# Patient Record
Sex: Male | Born: 1935
Health system: Southern US, Community
[De-identification: ages and names within clinical notes are randomized; demographics above are authoritative.]

## PROBLEM LIST (undated history)

## (undated) DIAGNOSIS — K769 Liver disease, unspecified: Secondary | ICD-10-CM

## (undated) DIAGNOSIS — F0390 Unspecified dementia without behavioral disturbance: Secondary | ICD-10-CM

## (undated) DIAGNOSIS — H00019 Hordeolum externum unspecified eye, unspecified eyelid: Secondary | ICD-10-CM

## (undated) DIAGNOSIS — F0280 Dementia in other diseases classified elsewhere without behavioral disturbance: Secondary | ICD-10-CM

## (undated) DIAGNOSIS — I10 Essential (primary) hypertension: Secondary | ICD-10-CM

## (undated) DIAGNOSIS — K7290 Hepatic failure, unspecified without coma: Secondary | ICD-10-CM

## (undated) DIAGNOSIS — K7682 Hepatic encephalopathy: Secondary | ICD-10-CM

---

## 2001-08-12 ENCOUNTER — Emergency Department (HOSPITAL_COMMUNITY): Admission: EM | Admit: 2001-08-12 | Discharge: 2001-08-12 | Payer: Self-pay | Admitting: Emergency Medicine

## 2002-03-22 ENCOUNTER — Emergency Department (HOSPITAL_COMMUNITY): Admission: EM | Admit: 2002-03-22 | Discharge: 2002-03-22 | Payer: Self-pay | Admitting: Emergency Medicine

## 2002-08-22 ENCOUNTER — Emergency Department (HOSPITAL_COMMUNITY): Admission: EM | Admit: 2002-08-22 | Discharge: 2002-08-22 | Payer: Self-pay

## 2002-09-24 ENCOUNTER — Emergency Department (HOSPITAL_COMMUNITY): Admission: EM | Admit: 2002-09-24 | Discharge: 2002-09-24 | Payer: Self-pay | Admitting: Emergency Medicine

## 2004-04-02 ENCOUNTER — Emergency Department (HOSPITAL_COMMUNITY): Admission: EM | Admit: 2004-04-02 | Discharge: 2004-04-02 | Payer: Self-pay | Admitting: Emergency Medicine

## 2004-11-23 ENCOUNTER — Emergency Department (HOSPITAL_COMMUNITY): Admission: EM | Admit: 2004-11-23 | Discharge: 2004-11-23 | Payer: Self-pay | Admitting: *Deleted

## 2004-12-03 ENCOUNTER — Encounter (HOSPITAL_COMMUNITY): Admission: RE | Admit: 2004-12-03 | Discharge: 2005-01-02 | Payer: Self-pay

## 2004-12-10 ENCOUNTER — Observation Stay (HOSPITAL_COMMUNITY): Admission: EM | Admit: 2004-12-10 | Discharge: 2004-12-11 | Payer: Self-pay | Admitting: Emergency Medicine

## 2005-01-04 ENCOUNTER — Encounter (HOSPITAL_COMMUNITY): Admission: RE | Admit: 2005-01-04 | Discharge: 2005-01-22 | Payer: Self-pay | Admitting: *Deleted

## 2005-01-25 ENCOUNTER — Encounter (HOSPITAL_COMMUNITY): Admission: RE | Admit: 2005-01-25 | Discharge: 2005-02-24 | Payer: Self-pay | Admitting: *Deleted

## 2005-02-26 ENCOUNTER — Encounter (HOSPITAL_COMMUNITY): Admission: RE | Admit: 2005-02-26 | Discharge: 2005-03-28 | Payer: Self-pay | Admitting: *Deleted

## 2005-08-07 ENCOUNTER — Emergency Department (HOSPITAL_COMMUNITY): Admission: EM | Admit: 2005-08-07 | Discharge: 2005-08-07 | Payer: Self-pay | Admitting: Emergency Medicine

## 2008-10-11 ENCOUNTER — Emergency Department (HOSPITAL_COMMUNITY): Admission: EM | Admit: 2008-10-11 | Discharge: 2008-10-11 | Payer: Self-pay | Admitting: Emergency Medicine

## 2008-11-23 ENCOUNTER — Emergency Department (HOSPITAL_COMMUNITY): Admission: EM | Admit: 2008-11-23 | Discharge: 2008-11-23 | Payer: Self-pay | Admitting: Emergency Medicine

## 2010-04-05 ENCOUNTER — Emergency Department (HOSPITAL_COMMUNITY)
Admission: EM | Admit: 2010-04-05 | Discharge: 2010-04-05 | Payer: Self-pay | Source: Home / Self Care | Admitting: Emergency Medicine

## 2010-08-03 LAB — CBC
Hemoglobin: 15.7 g/dL (ref 13.0–17.0)
MCHC: 36 g/dL (ref 30.0–36.0)
MCV: 89.4 fL (ref 78.0–100.0)
RBC: 4.88 MIL/uL (ref 4.22–5.81)
WBC: 8.8 10*3/uL (ref 4.0–10.5)

## 2010-08-03 LAB — POCT CARDIAC MARKERS: Myoglobin, poc: 183 ng/mL (ref 12–200)

## 2010-08-03 LAB — DIFFERENTIAL
Basophils Absolute: 0 10*3/uL (ref 0.0–0.1)
Basophils Relative: 0 % (ref 0–1)
Eosinophils Absolute: 0.2 10*3/uL (ref 0.0–0.7)
Eosinophils Relative: 2 % (ref 0–5)
Lymphs Abs: 1.3 10*3/uL (ref 0.7–4.0)
Neutrophils Relative %: 76 % (ref 43–77)

## 2010-08-03 LAB — COMPREHENSIVE METABOLIC PANEL
ALT: 26 U/L (ref 0–53)
AST: 30 U/L (ref 0–37)
CO2: 28 mEq/L (ref 19–32)
Calcium: 9.1 mg/dL (ref 8.4–10.5)
Chloride: 102 mEq/L (ref 96–112)
Creatinine, Ser: 1.06 mg/dL (ref 0.4–1.5)
GFR calc non Af Amer: >60 mL/min (ref >60)
Glucose, Bld: 104 mg/dL — ABNORMAL HIGH (ref 70–99)
Sodium: 138 mEq/L (ref 135–145)
Total Bilirubin: 0.9 mg/dL (ref 0.3–1.2)

## 2010-08-03 LAB — BRAIN NATRIURETIC PEPTIDE: Pro B Natriuretic peptide (BNP): <30 pg/mL (ref 0.0–100.0)

## 2010-09-11 NOTE — H&P (Signed)
NAME:  YOVANY, CLOCK NO.:  [id number]   MEDICAL RECORD NO.:  [id number]          PATIENT TYPE:  OBV   LOCATION:  A213                          FACILITY:  [facility]   PHYSICIAN:  Calvert Cantor, M.D.     DATE OF BIRTH:  [birth year]   DATE OF ADMISSION:  12/10/2004  DATE OF DISCHARGE:  LH                                HISTORY & PHYSICAL   PRIMARY CARE PHYSICIAN:  Dr. Kateri Mc.   PRESENTING COMPLAINT:  Swelling of the left half of his tongue.   HISTORY OF PRESENT ILLNESS:  This is a 77 year old white male, well educated  and quite knowledgeable about his health issues. The patient states that he  has had angioedema secondary to Lotensin in the past which first occurred  five years ago. Lotensin was discontinued; however, he had episodes of  angioedema about five or six times since then. His last episode was one year  ago. Recently, the patient had a cath on November 27, 2004 with stent  placement. Since then, he has been placed on beta blocker, aspirin, Lasix,  Lipitor, and Zetia.   The patient states that today, while he was eating lunch about 1:30 in the  afternoon, he noticed that his tongue was swollen. He came into the ER.  While he was coming in, his tongue swelling worsened. The patient stated  that once in the ER he was given steroids, Pepcid, and Benadryl, and the  swelling improved. Currently, there is no swelling at all. He does not  complain of any itching, flushing, rash, or lightheadedness associated with  the swelling. His blood pressure was also normal once he came to the ER. He  did not have any rash as per the ER doctor.   PAST MEDICAL HISTORY:  1.  Coronary artery disease, status post cath with stent placement.  2.  Hypertension.  3.  Hypercholesterolemia.  4.  Obesity.  5.  Gout.   PAST SURGICAL HISTORY:  He had a cath on November 27, 2004. He has had an  appendectomy and a mastoidectomy in the past.   ALLERGIES:  He is allergic to LOTENSIN which  causes angioedema. He has no  allergies to food or anything in the environment that he knows of. He has  been checked by an allergist a couple of years ago who ran a panel of tests  on him and told him that she could not find anything that he was allergic  to.   FAMILY HISTORY:  His mother passed away of old age. Father passed away of  unknown cause. He has a sister who had shortness of breath. He is not sure  why.   SOCIAL HISTORY:  He smokes one cigar every now and then. He drinks wine  occasionally. He is separated. He lives alone.   PHYSICAL EXAMINATION:  VITAL SIGNS:  Blood pressure was 136/80, temperature  98, pulse 79, respiratory rate 20, pulse oximeter 97% on room.  HEENT:  Normocephalic and atraumatic. Pupils are equal, round, and reactive  to light. Oral mucosa is moist. Tongue is normal  size. He is not having any  slurred speech.  NECK:  Supple. There is no lymphadenopathy.  HEART:  Regular rate and rhythm.  LUNGS:  Clear bilaterally.  ABDOMEN:  Soft, obese, nontender, and nondistended. Bowel sounds are  positive.  EXTREMITIES:  Show no clubbing, cyanosis, or edema. Pedal pulses are  positive.   REVIEW OF SYSTEMS:  Further negative for cough, fever, chills, runny nose,  chest pain, diarrhea, dysuria, pyuria.   BLOOD WORK:  WBC count is 13.2 with a slight left shift, hemoglobin 14.9,  hematocrit 42.9, platelets 333,000. Blood smear further shows polychromasia  of the RBCs and large platelets. Sodium 138, potassium 4.3, chloride 103,  bicarbonate 23, glucose 165, BUN 19, creatinine 1.3. Alkaline phosphate 113,  AST 41, ALT 30. Total protein 7.3. Albumin 4.0, calcium 9.1. CK is 78, CK-MB  is 2.4. Troponin is 0.03.   ASSESSMENT/PLAN:  This is a 77 year old white male who has a history of  angioedema who states that his tongue was swollen today. This is also most  likely secondary to angioedema. The patient received Solu-Medrol in the  emergency room. He will be  continued on a prednisone taper. Furthermore, he  is on Pepcid and Benadryl. Blood work is showing elevated white count with  some slight left shift; however, the patient has no signs of infection. I  will do a urinalysis and check a chest x-ray. For now, I will hold off on  antibiotics.   Cardiac enzymes will be checked and repeat blood work will be done in the  morning to further check for leukocytosis. In addition, I will obtain a C1  esterase inhibitor level and a C1Q level to rule out acquired angioedema. I  will resume him on his home medications. He is ambulatory and therefore will  not need deep vein thrombosis precautions currently.      Calvert Cantor, M.D.  Electronically Signed     SR/MEDQ  D:  12/10/2004  T:  12/10/2004  Job:  [id number]

## 2010-09-11 NOTE — Discharge Summary (Signed)
NAME:  Joseph Delgado, Joseph Delgado NO.:  [id number]   MEDICAL RECORD NO.:  [id number]          PATIENT TYPE:  OBV   LOCATION:  A213                          FACILITY:  [facility]   PHYSICIAN:  Calvert Cantor, M.D.     DATE OF BIRTH:  09/22/1928   DATE OF ADMISSION:  DATE OF DISCHARGE:  LH                                 DISCHARGE SUMMARY   DISCHARGE DIAGNOSES:  1.  Mild episode of angioedema.  2.  Coronary artery disease, status post catheterization with stent      placement.  3.  Hypertension.  4.  Hypercholesterolemia.  5.  Obesity.  6.  Gout.   DISCHARGE MEDICATIONS:  The patient is being discharged on rapid prednisone  taper.  He is also to take Benadryl 25-50 mg p.o. and Pepcid 20 mg p.o.  q.12h. if he has another episode of angioedema.   The patient is to continue on his outpatient medications, which include  Lopressor, aspirin, Lasix, Lipitor, Zetia.   HOSPITAL COURSE:  This is a 77 year old white male who was admitted for an  episode of tongue swelling.  The patient's swelling improved once he  received steroids, Pepcid, and Benadryl in the ER.  He was monitored  overnight to look for further angioedema.  The patient did well through the  night and had no complaints.   The patient has a history of angioedema from ACE inhibitors.  Currently, he  believed that his beta blockers may be causing this episode of angioedema.  I have explained to him that I strongly doubt this; however, I have referred  him back to his primary care physician to decide whether or not he needs to  continue taking beta blocker.  He has recently had a cath and has coronary  artery disease and therefore most likely needs to continue on the beta  blockers.   The patient states that he has had about 5 episodes of angioedema ever since  he stopped the ACE inhibitor.  No cause has been found.  He has been seen by  allergist who was unable to determine anything that he is allergic to.  He  states  that he is going to follow up with an allergist at [facility]. I have  obtained a C1 esterase inhibitor and a C1Q level on him to rule out acquired  angioedema.  The results are pending.   FOLLOWUP:  If there are any positive blood results, I will be contacting the  patient to let him know.   VITALS ON DISCHARGE:  Temperature 97.4, pulse 90, respiratory rate 18, blood  pressure 119/61.   BLOOD WORK:  White count is 17.2, which was normal on admission but has  elevated from the steroids.  Hemoglobin is 14.1.  Hematocrit 40.6.  Platelets 325.  Sodium 138.  Potassium 4.7.  Chloride 106.  Bicarb 25.  Glucose 217.  BUN 20.  Creatinine 1.3.  Calcium 92.  CK was 78.  CK-MB was  2.4.  Troponin was 0.03.      Calvert Cantor, M.D.  Electronically Signed  SR/MEDQ  D:  12/12/2004  T:  12/12/2004  Job:  [id number]   cc:   Wyvonnia Dusky, M.D.  [facility]  [address]  [place], [place].

## 2013-06-12 ENCOUNTER — Encounter (HOSPITAL_COMMUNITY): Payer: Self-pay | Admitting: Emergency Medicine

## 2013-06-12 DIAGNOSIS — Z792 Long term (current) use of antibiotics: Secondary | ICD-10-CM | POA: Insufficient documentation

## 2013-06-12 DIAGNOSIS — S0003XA Contusion of scalp, initial encounter: Secondary | ICD-10-CM | POA: Insufficient documentation

## 2013-06-12 DIAGNOSIS — Z8669 Personal history of other diseases of the nervous system and sense organs: Secondary | ICD-10-CM | POA: Insufficient documentation

## 2013-06-12 DIAGNOSIS — Z8719 Personal history of other diseases of the digestive system: Secondary | ICD-10-CM | POA: Insufficient documentation

## 2013-06-12 DIAGNOSIS — S1093XA Contusion of unspecified part of neck, initial encounter: Secondary | ICD-10-CM | POA: Insufficient documentation

## 2013-06-12 DIAGNOSIS — Y929 Unspecified place or not applicable: Secondary | ICD-10-CM | POA: Insufficient documentation

## 2013-06-12 DIAGNOSIS — D649 Anemia, unspecified: Secondary | ICD-10-CM | POA: Insufficient documentation

## 2013-06-12 DIAGNOSIS — Z7982 Long term (current) use of aspirin: Secondary | ICD-10-CM | POA: Insufficient documentation

## 2013-06-12 DIAGNOSIS — W1809XA Striking against other object with subsequent fall, initial encounter: Secondary | ICD-10-CM | POA: Insufficient documentation

## 2013-06-12 DIAGNOSIS — F0280 Dementia in other diseases classified elsewhere without behavioral disturbance: Secondary | ICD-10-CM | POA: Insufficient documentation

## 2013-06-12 DIAGNOSIS — G309 Alzheimer's disease, unspecified: Secondary | ICD-10-CM | POA: Insufficient documentation

## 2013-06-12 DIAGNOSIS — Y9389 Activity, other specified: Secondary | ICD-10-CM | POA: Insufficient documentation

## 2013-06-12 DIAGNOSIS — Z79899 Other long term (current) drug therapy: Secondary | ICD-10-CM | POA: Insufficient documentation

## 2013-06-12 HISTORY — DX: Liver disease, unspecified: K76.9

## 2013-06-12 HISTORY — DX: Hepatic encephalopathy: K76.82

## 2013-06-12 HISTORY — DX: Hordeolum externum unspecified eye, unspecified eyelid: H00.019

## 2013-06-12 HISTORY — DX: Unspecified dementia, unspecified severity, without behavioral disturbance, psychotic disturbance, mood disturbance, and anxiety: F03.90

## 2013-06-12 HISTORY — DX: Dementia in other diseases classified elsewhere, unspecified severity, without behavioral disturbance, psychotic disturbance, mood disturbance, and anxiety: F02.80

## 2013-06-12 HISTORY — DX: Hepatic failure, unspecified without coma: K72.90

## 2013-07-01 ENCOUNTER — Emergency Department (HOSPITAL_COMMUNITY)
Admission: EM | Admit: 2013-07-01 | Discharge: 2013-07-01 | Disposition: A | Discharge status: Home / Self Care | Payer: Medicare Other | Attending: Emergency Medicine | Admitting: Emergency Medicine

## 2013-07-01 DIAGNOSIS — Z79899 Other long term (current) drug therapy: Secondary | ICD-10-CM | POA: Insufficient documentation

## 2013-07-01 DIAGNOSIS — Y9301 Activity, walking, marching and hiking: Secondary | ICD-10-CM | POA: Insufficient documentation

## 2013-07-01 DIAGNOSIS — Z8669 Personal history of other diseases of the nervous system and sense organs: Secondary | ICD-10-CM | POA: Insufficient documentation

## 2013-07-01 DIAGNOSIS — Z043 Encounter for examination and observation following other accident: Secondary | ICD-10-CM | POA: Insufficient documentation

## 2013-07-01 DIAGNOSIS — Z7982 Long term (current) use of aspirin: Secondary | ICD-10-CM | POA: Insufficient documentation

## 2013-07-01 DIAGNOSIS — W010XXA Fall on same level from slipping, tripping and stumbling without subsequent striking against object, initial encounter: Secondary | ICD-10-CM | POA: Insufficient documentation

## 2013-07-01 DIAGNOSIS — G309 Alzheimer's disease, unspecified: Secondary | ICD-10-CM | POA: Insufficient documentation

## 2013-07-01 DIAGNOSIS — F0280 Dementia in other diseases classified elsewhere without behavioral disturbance: Secondary | ICD-10-CM | POA: Insufficient documentation

## 2013-07-01 DIAGNOSIS — Y929 Unspecified place or not applicable: Secondary | ICD-10-CM | POA: Insufficient documentation

## 2013-07-01 DIAGNOSIS — Z8719 Personal history of other diseases of the digestive system: Secondary | ICD-10-CM | POA: Insufficient documentation

## 2013-07-01 NOTE — ED Notes (Signed)
[facility] after walker slipped out from under him in hallway, pt fell onto tile floor, pt states he hit his head, denies LOC. Per EMS patient is demented at baseline mental status.

## 2013-07-01 NOTE — ED Notes (Signed)
Bed: WA24 Expected date:  Expected time:  Means of arrival:  Comments: fall 

## 2013-07-01 NOTE — Discharge Instructions (Signed)
Fall precautions. Return to ER if worse, new symptoms, severe pain, change in mental status, other concern.     Fall Prevention and Home Safety Falls cause injuries and can affect all age groups. It is possible to use preventive measures to significantly decrease the likelihood of falls. There are many simple measures which can make your home safer and prevent falls. OUTDOORS  Repair cracks and edges of walkways and driveways.  Remove high doorway thresholds.  Trim shrubbery on the main path into your home.  Have good outside lighting.  Clear walkways of tools, rocks, debris, and clutter.  Check that handrails are not broken and are securely fastened. Both sides of steps should have handrails.  Have leaves, snow, and ice cleared regularly.  Use sand or salt on walkways during winter months.  In the garage, clean up grease or oil spills. BATHROOM  Install night lights.  Install grab bars by the toilet and in the tub and shower.  Use non-skid mats or decals in the tub or shower.  Place a plastic non-slip stool in the shower to sit on, if needed.  Keep floors dry and clean up all water on the floor immediately.  Remove soap buildup in the tub or shower on a regular basis.  Secure bath mats with non-slip, double-sided rug tape.  Remove throw rugs and tripping hazards from the floors. BEDROOMS  Install night lights.  Make sure a bedside light is easy to reach.  Do not use oversized bedding.  Keep a telephone by your bedside.  Have a firm chair with side arms to use for getting dressed.  Remove throw rugs and tripping hazards from the floor. KITCHEN  Keep handles on pots and pans turned toward the center of the stove. Use back burners when possible.  Clean up spills quickly and allow time for drying.  Avoid walking on wet floors.  Avoid hot utensils and knives.  Position shelves so they are not too high or low.  Place commonly used objects within easy  reach.  If necessary, use a sturdy step stool with a grab bar when reaching.  Keep electrical cables out of the way.  Do not use floor polish or wax that makes floors slippery. If you must use wax, use non-skid floor wax.  Remove throw rugs and tripping hazards from the floor. STAIRWAYS  Never leave objects on stairs.  Place handrails on both sides of stairways and use them. Fix any loose handrails. Make sure handrails on both sides of the stairways are as long as the stairs.  Check carpeting to make sure it is firmly attached along stairs. Make repairs to worn or loose carpet promptly.  Avoid placing throw rugs at the top or bottom of stairways, or properly secure the rug with carpet tape to prevent slippage. Get rid of throw rugs, if possible.  Have an electrician put in a light switch at the top and bottom of the stairs. OTHER FALL PREVENTION TIPS  Wear low-heel or rubber-soled shoes that are supportive and fit well. Wear closed toe shoes.  When using a stepladder, make sure it is fully opened and both spreaders are firmly locked. Do not climb a closed stepladder.  Add color or contrast paint or tape to grab bars and handrails in your home. Place contrasting color strips on first and last steps.  Learn and use mobility aids as needed. Install an electrical emergency response system.  Turn on lights to avoid dark areas. Replace light bulbs that  burn out immediately. Get light switches that glow.  Arrange furniture to create clear pathways. Keep furniture in the same place.  Firmly attach carpet with non-skid or double-sided tape.  Eliminate uneven floor surfaces.  Select a carpet pattern that does not visually hide the edge of steps.  Be aware of all pets. OTHER HOME SAFETY TIPS  Set the water temperature for 120 F (48.8 C).  Keep emergency numbers on or near the telephone.  Keep smoke detectors on every level of the home and near sleeping areas. Document Released:  04/02/2002 Document Revised: 10/12/2011 Document Reviewed: 07/02/2011 [facility] Patient Information 2014 [place], [place].

## 2013-07-01 NOTE — ED Provider Notes (Signed)
CSN: [id number]     Arrival date & time 07/01/13  1109 History   First MD Initiated Contact with Patient 07/01/13 1112     Chief Complaint  Patient presents with  . Fall     (Consider location/radiation/quality/duration/timing/severity/associated sxs/prior Treatment) Patient is a 80 y.o. male presenting with fall. The history is provided by the patient and the EMS personnel.  Fall Pertinent negatives include no chest pain, no abdominal pain, no headaches and no shortness of breath.  pt with hx dementia, from [facility], s/p fall this morning. Pt was walking w walker.  At baseline walks v slowly w walker. ems states walker slid/got away from pt, he feel to ground, no loc.  Per ems report, pts mental status has remained at baseline since fall.  Pt denies any lightheadedness or dizziness. No loc. No headache or head contusion. No neck or back pain. No numbness/weakness. No anticoag use. Pt states feels fine, no c/o.       Past Medical History  Diagnosis Date  . Dementia   . Alzheimer disease   . Hepatic encephalopathy   . Liver disease   . Stye    No past surgical history on file. No family history on file. History  Substance Use Topics  . Smoking status: Unknown If Ever Smoked  . Smokeless tobacco: Not on file  . Alcohol Use: No    Review of Systems  Constitutional: Negative for fever.  HENT: Negative for facial swelling.   Eyes: Negative for redness.  Respiratory: Negative for shortness of breath.   Cardiovascular: Negative for chest pain.  Gastrointestinal: Negative for nausea, vomiting and abdominal pain.  Genitourinary: Negative for flank pain.  Musculoskeletal: Negative for back pain and neck pain.  Skin: Negative for rash.  Neurological: Negative for weakness, numbness and headaches.  Hematological: Does not bruise/bleed easily.  Psychiatric/Behavioral: Negative for agitation.      Allergies  Review of patient's allergies indicates no known allergies.  Home  Medications   Current Outpatient Rx  Name  Route  Sig  Dispense  Refill  . aspirin 81 MG tablet   Oral   Take 81 mg by mouth daily.         . bicalutamide (CASODEX) 50 MG tablet   Oral   Take 50 mg by mouth daily.         Marland Kitchen erythromycin ophthalmic ointment      Apply 1/4 inch ribbon in the left eye four times per day         . haloperidol (HALDOL) 1 MG tablet   Oral   Take 1 mg by mouth 2 (two) times daily.         Marland Kitchen lactulose (CHRONULAC) 10 GM/15ML solution   Oral   Take 30 g by mouth 2 (two) times daily as needed for mild constipation.         . Melatonin 1 MG TABS   Oral   Take 1 tablet by mouth at bedtime as needed (sleep).         . Multiple Vitamin (THERA/BETA-CAROTENE PO)   Oral   Take 1 tablet by mouth daily.         Marland Kitchen omeprazole (PRILOSEC) 20 MG capsule   Oral   Take 20 mg by mouth daily.          BP 146/54  Pulse 87  Temp(Src) 98.2 F (36.8 C) (Oral)  Resp 14  SpO2 98% Physical Exam  Nursing note and vitals reviewed. Constitutional:  He is oriented to person, place, and time. He appears well-developed and well-nourished. No distress.  HENT:  Head: Atraumatic.  Mouth/Throat: Oropharynx is clear and moist.  No facial or scalp pain, sts, or tenderness.   Eyes: Pupils are equal, round, and reactive to light.  Neck: Normal range of motion. Neck supple. No tracheal deviation present.  Cardiovascular: Normal rate, normal heart sounds and intact distal pulses.   Pulmonary/Chest: Effort normal and breath sounds normal. No accessory muscle usage. No respiratory distress. He exhibits no tenderness.  Abdominal: Soft. Bowel sounds are normal. He exhibits no distension. There is no tenderness.  Musculoskeletal: Normal range of motion.  CTLS spine, non tender, aligned, no step off. Good rom bil ext without pain or focal bony tenderness.   Neurological: He is alert and oriented to person, place, and time.  Motor intact bil. sens intact.   Skin:  Skin is warm and dry. He is not diaphoretic.  Psychiatric: He has a normal mood and affect.    ED Course  Procedures (including critical care time)   MDM  Reviewed nursing notes and prior charts for additional history.   S/p fall at [facility], sounds mechanical, no faintess or dizziness. No loc.   Pt denies pain or injury. No headache. No neck or back pain. Spine nt.  Pt continues to deny pain, states feels fine.  Pt appears stable for d/c.      Suzi RootsKevin E Andreea Arca, MD 07/01/13 1147

## 2013-10-25 ENCOUNTER — Encounter (HOSPITAL_COMMUNITY): Payer: Self-pay | Admitting: Emergency Medicine

## 2013-10-25 ENCOUNTER — Emergency Department (HOSPITAL_COMMUNITY)
Admission: EM | Admit: 2013-10-25 | Discharge: 2013-10-25 | Disposition: A | Discharge status: Home / Self Care | Payer: Medicare Other | Attending: Emergency Medicine | Admitting: Emergency Medicine

## 2013-10-25 ENCOUNTER — Emergency Department (HOSPITAL_COMMUNITY): Payer: Medicare Other

## 2013-10-25 DIAGNOSIS — W1809XA Striking against other object with subsequent fall, initial encounter: Secondary | ICD-10-CM | POA: Insufficient documentation

## 2013-10-25 DIAGNOSIS — Y93K1 Activity, walking an animal: Secondary | ICD-10-CM | POA: Insufficient documentation

## 2013-10-25 DIAGNOSIS — IMO0002 Reserved for concepts with insufficient information to code with codable children: Secondary | ICD-10-CM | POA: Insufficient documentation

## 2013-10-25 DIAGNOSIS — Z8669 Personal history of other diseases of the nervous system and sense organs: Secondary | ICD-10-CM | POA: Insufficient documentation

## 2013-10-25 DIAGNOSIS — Y929 Unspecified place or not applicable: Secondary | ICD-10-CM | POA: Insufficient documentation

## 2013-10-25 DIAGNOSIS — W010XXA Fall on same level from slipping, tripping and stumbling without subsequent striking against object, initial encounter: Secondary | ICD-10-CM | POA: Insufficient documentation

## 2013-10-25 DIAGNOSIS — Z8719 Personal history of other diseases of the digestive system: Secondary | ICD-10-CM | POA: Insufficient documentation

## 2013-10-25 DIAGNOSIS — Z8659 Personal history of other mental and behavioral disorders: Secondary | ICD-10-CM | POA: Insufficient documentation

## 2013-10-25 DIAGNOSIS — S43016A Anterior dislocation of unspecified humerus, initial encounter: Secondary | ICD-10-CM | POA: Insufficient documentation

## 2013-10-25 DIAGNOSIS — Z791 Long term (current) use of non-steroidal anti-inflammatories (NSAID): Secondary | ICD-10-CM | POA: Insufficient documentation

## 2013-10-25 DIAGNOSIS — S43005A Unspecified dislocation of left shoulder joint, initial encounter: Secondary | ICD-10-CM

## 2013-10-25 DIAGNOSIS — Z7902 Long term (current) use of antithrombotics/antiplatelets: Secondary | ICD-10-CM | POA: Insufficient documentation

## 2013-10-25 DIAGNOSIS — Z79899 Other long term (current) drug therapy: Secondary | ICD-10-CM | POA: Insufficient documentation

## 2013-10-25 DIAGNOSIS — Z87891 Personal history of nicotine dependence: Secondary | ICD-10-CM | POA: Insufficient documentation

## 2013-10-25 DIAGNOSIS — I10 Essential (primary) hypertension: Secondary | ICD-10-CM | POA: Insufficient documentation

## 2013-10-25 HISTORY — DX: Essential (primary) hypertension: I10

## 2013-10-25 MED ORDER — PROPOFOL 10 MG/ML IV BOLUS
80.0000 mg | Freq: Once | INTRAVENOUS | Status: AC
  Administered 2013-10-25: 80 mg via INTRAVENOUS
  Filled 2013-10-25: qty 1

## 2013-10-25 MED ORDER — FENTANYL CITRATE 0.05 MG/ML IJ SOLN
50.0000 ug | INTRAMUSCULAR | Status: DC | PRN
  Administered 2013-10-25: 50 ug via INTRAVENOUS
  Filled 2013-10-25: qty 2

## 2013-10-25 MED ORDER — HYDROCODONE-ACETAMINOPHEN 5-325 MG PO TABS: 2.0000 | ORAL_TABLET | ORAL | Status: AC | PRN

## 2013-10-25 NOTE — ED Notes (Signed)
Patient's dog tripped him. C/o left arm/shoulder pain. Deformity noted. CMS intact.

## 2013-10-25 NOTE — ED Provider Notes (Signed)
CSN: [id number]     Arrival date & time 10/25/13  1725 History  This chart was scribed for Rolland PorterMark Kindrick Lankford, MD by Milly JakobJohn Lee Graves, ED Scribe. The patient was seen in room APA14/APA14. Patient's care was started at 5:51 PM.     Chief Complaint  Patient presents with  . Fall   The history is provided by the patient. No language interpreter was used.   HPI Comments: Joseph Delgado is a 80 y.o. male who presents to the Emergency Department complaining of a fall that occurred earlier today. He states that he was tripped over his dog, but denies clear memory of the fall. He states that he was walking his dog on a leash in his left hand. He states that he fell to the ground and hit is face on the dirk. He reports severe, constant pain in his left shoulder that radiates into his back and neck. He denies other pain in his back and neck. He reports a high pain tolerance, but states that this is bothering him. He denies pain in his hips, and bilateral lower extremities.   Past Medical History  Diagnosis Date  . Dementia   . Alzheimer disease   . Hepatic encephalopathy   . Liver disease   . Stye   . Hypertension    History reviewed. No pertinent past surgical history. History reviewed. No pertinent family history. History  Substance Use Topics  . Smoking status: Former Games developermoker  . Smokeless tobacco: Not on file  . Alcohol Use: No    Review of Systems  Constitutional: Negative for fever, chills, diaphoresis, appetite change and fatigue.  HENT: Negative for mouth sores, sore throat and trouble swallowing.   Eyes: Negative for visual disturbance.  Respiratory: Negative for cough, chest tightness, shortness of breath and wheezing.   Cardiovascular: Negative for chest pain.  Gastrointestinal: Negative for nausea, vomiting, abdominal pain, diarrhea and abdominal distention.  Endocrine: Negative for polydipsia, polyphagia and polyuria.  Genitourinary: Negative for dysuria, frequency and hematuria.   Musculoskeletal: Positive for arthralgias (Left Shoulder) and neck pain (radiating from left shoulder). Negative for gait problem.  Skin: Negative for color change, pallor and rash.  Neurological: Negative for dizziness, syncope, light-headedness and headaches.  Hematological: Does not bruise/bleed easily.  Psychiatric/Behavioral: Negative for behavioral problems and confusion.      Allergies  Ace inhibitors; Aspirin; and Simvastatin  Home Medications   Prior to Admission medications   Medication Sig Start Date End Date Taking? Authorizing Provider  allopurinol (ZYLOPRIM) 100 MG tablet Take 200 mg by mouth daily. 11/14/12  Yes Historical Provider, MD  atorvastatin (LIPITOR) 20 MG tablet Take 40 mg by mouth daily. 11/10/12  Yes Historical Provider, MD  B Complex Vitamins (VITAMIN-B COMPLEX) TABS Take 1 tablet by mouth daily.   Yes Historical Provider, MD  cetirizine (ZYRTEC) 10 MG tablet Take 10 mg by mouth daily. 04/13/10  Yes Historical Provider, MD  cloNIDine (CATAPRES - DOSED IN MG/24 HR) 0.2 mg/24hr patch Place 0.4 mg onto the skin once a week. 02/06/13  Yes Historical Provider, MD  clopidogrel (PLAVIX) 75 MG tablet Take 75 mg by mouth daily. 02/06/13  Yes Historical Provider, MD  colchicine (COLCRYS) 0.6 MG tablet Take 0.6 mg by mouth daily as needed. 02/21/12  Yes Historical Provider, MD  diclofenac sodium (VOLTAREN) 1 % GEL Apply 1 application topically 2 (two) times daily as needed. 04/30/09  Yes Historical Provider, MD  hydrochlorothiazide (HYDRODIURIL) 25 MG tablet Take 25 mg by mouth daily.  02/06/13  Yes Historical Provider, MD  ipratropium (ATROVENT) 0.06 % nasal spray Place 2 sprays into the nose 3 (three) times daily. 08/27/13 08/27/14 Yes Historical Provider, MD  ipratropium-albuterol (DUONEB) 0.5-2.5 (3) MG/3ML SOLN Inhale 1 vial into the lungs 4 (four) times daily as needed. 04/03/12  Yes Historical Provider, MD  metoprolol succinate (TOPROL-XL) 50 MG 24 hr tablet Take 25 mg by  mouth daily. 02/06/13  Yes Historical Provider, MD  nabumetone (RELAFEN) 500 MG tablet Take 1,000 mg by mouth daily. 05/22/10  Yes Historical Provider, MD  nitroGLYCERIN (NITROSTAT) 0.4 MG SL tablet Place 0.4 mg under the tongue as needed. 03/05/13  Yes Historical Provider, MD  Omega-3 Fatty Acids (FISH OIL) 1000 MG CAPS Take 1 capsule by mouth daily. 02/06/13  Yes Historical Provider, MD  ranitidine (ZANTAC) 75 MG tablet Take 75 mg by mouth daily. 04/13/10  Yes Historical Provider, MD  Travoprost, BAK Free, (TRAVATAN Z) 0.004 % SOLN ophthalmic solution Apply 1 drop to eye at bedtime. 08/28/13  Yes Historical Provider, MD  HYDROcodone-acetaminophen (NORCO/VICODIN) 5-325 MG per tablet Take 2 tablets by mouth every 4 (four) hours as needed. 10/25/13   Rolland PorterMark Serayah Yazdani, MD   Triage Vitals: BP 130/82  Pulse 79  Temp(Src) 97.9 F (36.6 C) (Oral)  Resp 17  Ht 6\' 1"  (1.854 m)  Wt 189 lb (85.73 kg)  BMI 24.94 kg/m2  SpO2 98% Physical Exam  Constitutional: He is oriented to person, place, and time. He appears well-developed and well-nourished. No distress.  HENT:  Head: Normocephalic.  Facial abrasions.  Eyes: Conjunctivae are normal. Pupils are equal, round, and reactive to light. No scleral icterus.  Neck: Normal range of motion. Neck supple. No thyromegaly present.  Cardiovascular: Normal rate and regular rhythm.  Exam reveals no gallop and no friction rub.   No murmur heard. Pulmonary/Chest: Effort normal and breath sounds normal. No respiratory distress. He has no wheezes. He has no rales.  Abdominal: Soft. Bowel sounds are normal. He exhibits no distension. There is no tenderness. There is no rebound.  Musculoskeletal: Normal range of motion.  Non tender on his neck and back. Palpable anterior dislocation of his left shoulder. Normal sensation and capillary refill to LUE.  Neurological: He is alert and oriented to person, place, and time.  Skin: Skin is warm and dry. No rash noted.  Psychiatric:  He has a normal mood and affect. His behavior is normal.    ED Course  Procedural sedation Date/Time: 10/25/2013 8:30 PM Performed by: Rolland PorterJAMES, Sherah Lund Authorized by: Rolland PorterJAMES, Kentravious Lipford Consent: Verbal consent obtained. written consent obtained. Risks and benefits: risks, benefits and alternatives were discussed Consent given by: patient Patient understanding: patient states understanding of the procedure being performed Patient identity confirmed: verbally with patient Time out: Immediately prior to procedure a "time out" was called to verify the correct patient, procedure, equipment, support staff and site/side marked as required. Local anesthesia used: no Patient sedated: yes Sedation type: moderate (conscious) sedation Sedatives: propofol and see MAR for details Sedation start date/time: 10/25/2013 8:15 PM Sedation end date/time: 10/25/2013 8:30 PM Vitals: Vital signs were monitored during sedation. Patient tolerance: Patient tolerated the procedure well with no immediate complications.  Reduction of dislocation Date/Time: 10/25/2013 9:06 PM Performed by: Rolland PorterJAMES, Temesha Queener Authorized by: Rolland PorterJAMES, Dayden Viverette Consent: Verbal consent obtained. written consent obtained. Risks and benefits: risks, benefits and alternatives were discussed Consent given by: patient Patient understanding: patient states understanding of the procedure being performed Patient consent: the patient's understanding of the procedure  matches consent given Local anesthesia used: no Patient sedated: yes Patient tolerance: Patient tolerated the procedure well with no immediate complications. Comments: Lt shoulder reduced with moderate difficulty.  Post procedure xray shows reduction.  No fracture.  + Axillary nerve sensation   (including critical care time) DIAGNOSTIC STUDIES: Oxygen Saturation is 98% on room air, normal by my interpretation.    COORDINATION OF CARE: 5:56 PM-Discussed treatment plan with pt at bedside and pt agreed to  plan.   Labs Review Labs Reviewed - No data to display  Imaging Review Dg Shoulder 1v Left  10/25/2013   CLINICAL DATA:  Left shoulder reduction  EXAM: LEFT SHOULDER - 1 VIEW  COMPARISON:  None.  FINDINGS: Interval reduction of left shoulder.  No fractures identified.  IMPRESSION: 1. Status post reduction of the left glenohumeral joint.   Electronically Signed   By: Signa Kell M.D.   On: 10/25/2013 20:04   Dg Shoulder Left  10/25/2013   CLINICAL DATA:  Status post fall  EXAM: LEFT SHOULDER - 2+ VIEW  COMPARISON:  None.  FINDINGS: Anterior dislocation of the left humeral head relative to the glenoid. No acute fracture. Normal acromioclavicular joint.  IMPRESSION: Anterior dislocation of the left shoulder.   Electronically Signed   By: Elige Ko   On: 10/25/2013 18:34   Dg Humerus Left  10/25/2013   CLINICAL DATA:  Fall.  Pain in left shoulder.  EXAM: LEFT HUMERUS - 2+ VIEW  COMPARISON:  None.  FINDINGS: There is anterior dislocation of the glenohumeral joint. No fractures are identified.  IMPRESSION: 1. Anterior dislocation of glenohumeral joint.   Electronically Signed   By: Signa Kell M.D.   On: 10/25/2013 18:35     EKG Interpretation None      MDM   Final diagnoses:  Shoulder dislocation, left, initial encounter    Patient discharge home. Orthopedic followup. Placed in a sling.  I personally performed the services described in this documentation, which was scribed in my presence. The recorded information has been reviewed and is accurate.    Rolland Porter, MD 10/25/13 2107

## 2013-10-25 NOTE — ED Notes (Signed)
Ice applied to left arm.

## 2013-10-25 NOTE — ED Notes (Signed)
Patient talking to me about the time that he served in the [employer]. Patient alert and oriented at this time. NAD noted

## 2013-10-25 NOTE — Discharge Instructions (Signed)
Shoulder Dislocation ° Shoulder dislocation is when your upper arm bone (humerus) is forced out of your shoulder joint. Your doctor will put your shoulder back into the joint by pulling on your arm or through surgery. Your arm will be placed in a shoulder immobilizer or sling. The shoulder immobilizer or sling holds your shoulder in place while it heals. °HOME CARE  °· Rest your injured joint. Do not move it until instructed to do so. °· Put ice on your injured joint as told by your doctor. °¨ Put ice in a plastic bag. °¨ Place a towel between your skin and the bag. °¨ Leave the ice on for 15-20 minutes at a time, every 2 hours while you are awake. °· Only take medicines as told by your doctor. °· Squeeze a ball to exercise your hand. °GET HELP RIGHT AWAY IF:  °· Your splint or sling becomes damaged. °· Your pain becomes worse, not better. °· You lose feeling in your arm or hand. °· Your arm or hand becomes white or cold. °MAKE SURE YOU:  °· Understand these instructions. °· Will watch your condition. °· Will get help right away if you are not doing well or get worse. °Document Released: 07/05/2011 Document Reviewed: 07/05/2011 °[employer]® Patient Information ©2015 [employer]. This information is not intended to replace advice given to you by your health care provider. Make sure you discuss any questions you have with your health care provider. ° °

## 2013-11-21 ENCOUNTER — Ambulatory Visit (HOSPITAL_COMMUNITY)
Admission: RE | Admit: 2013-11-21 | Discharge: 2013-11-21 | Disposition: A | Discharge status: Home / Self Care | Payer: Medicare Other | Source: Ambulatory Visit | Attending: Orthopedic Surgery | Admitting: Orthopedic Surgery

## 2013-11-21 DIAGNOSIS — M25519 Pain in unspecified shoulder: Secondary | ICD-10-CM | POA: Diagnosis not present

## 2013-11-21 DIAGNOSIS — I10 Essential (primary) hypertension: Secondary | ICD-10-CM | POA: Diagnosis not present

## 2013-11-21 DIAGNOSIS — IMO0001 Reserved for inherently not codable concepts without codable children: Secondary | ICD-10-CM | POA: Diagnosis present

## 2013-11-21 DIAGNOSIS — M25512 Pain in left shoulder: Secondary | ICD-10-CM

## 2013-11-21 DIAGNOSIS — M75102 Unspecified rotator cuff tear or rupture of left shoulder, not specified as traumatic: Secondary | ICD-10-CM | POA: Insufficient documentation

## 2013-11-21 DIAGNOSIS — M6281 Muscle weakness (generalized): Secondary | ICD-10-CM | POA: Diagnosis not present

## 2013-11-21 DIAGNOSIS — S43006A Unspecified dislocation of unspecified shoulder joint, initial encounter: Secondary | ICD-10-CM | POA: Insufficient documentation

## 2013-11-21 NOTE — Evaluation (Signed)
Occupational Therapy Evaluation  Patient Details  Name: Joseph Delgado MRN: [id number] Date of Birth: 05-25-1928  Today's Date: 11/21/2013 Time: 1340-1430 OT Time Calculation (min): 50 min OT Evaluation 1340-1400 20' Manual therapy 1400-1425 25'  Visit#: 1 of 16  Re-eval: 12/19/13  Assessment Diagnosis: S/P Left Shoulder Dislocation and Rotator Cuff Tear Next MD Visit: as needed Prior Therapy: n/a  Authorization: [employer]  Authorization Time Period: before 10th visit  Authorization Visit#: 1 of 10   Past Medical History:  Past Medical History  Diagnosis Date  . Dementia   . Alzheimer disease   . Hepatic encephalopathy   . Liver disease   . Stye   . Hypertension    Past Surgical History: No past surgical history on file.  Subjective S:  I can't use my left arm for much of anything right now. Pertinent History: On July 2, Joseph Delgado was walking his dog and lost his balance, falling forward and landing on his face and left arm.  He was not able to move his arm and called 911.  He was taken to [facility] ED and diagnosed with a left shoulder dislocation, he followed up with his MD at [facility] and was diagnosed with a torn left rotator cuff.  He was given a cortisone injection on 11/19/13 and referred to occupational therapy for evaluation and treatment.   Limitations: progress as tolerated Special Tests: FOTO 37/100 Patient Stated Goals: I want to be fully functional Pain Assessment Currently in Pain?: Yes Pain Score: 5  Pain Location: Shoulder Pain Orientation: Left Pain Type: Acute pain Pain Onset: 1 to 4 weeks ago Pain Frequency: Constant  Precautions/Restrictions  Precautions Precautions: None Restrictions Weight Bearing Restrictions: No  Balance Screening Balance Screen Has the patient fallen in the past 6 months: Yes How many times?: 1 Has the patient had a decrease in activity level because of a fear of falling? : No Is the patient reluctant to leave  their home because of a fear of falling? : No  Prior Functioning  Home Living Family/patient expects to be discharged to:: Private residence Living Arrangements: Alone Available Help at Discharge: Friend(s) Prior Function Level of Independence: Independent with basic ADLs;Independent with homemaking with ambulation Driving: Yes Vocation: Retired Comments: enjoys Counselling psychologist ADL/Vision/Perception ADL ADL Comments: unable to use his left arm with any functional activity.  Unable to wash his left arm pit region Dominant Hand: Right Vision - History Baseline Vision: Wears glasses all the time  Cognition/Observation Cognition Orientation Level: Oriented X4  Sensation/Coordination/Edema Sensation Light Touch: Appears Intact  Additional Assessments LUE PROM (degrees) LUE Overall PROM Comments: assessed in supine, ER/IR with shoulder adducted Left Shoulder Flexion: 110 Degrees Left Shoulder ABduction: 85 Degrees Left Shoulder Internal Rotation: 82 Degrees Left Shoulder External Rotation: 42 Degrees LUE Strength LUE Overall Strength Comments: not assessed due to lack of AROM Palpation Palpation: moderate fascial restrictions in left scapular and upper arm region     Exercise/Treatments    Manual Therapy Manual Therapy: Myofascial release Myofascial Release: Myofascial release and manual stretching to decrease pain and fascial restrictions and improve pain free mobiilty in left upper arm, scapular, and shoulder region.   Occupational Therapy Assessment and Plan OT Assessment and Plan Clinical Impression Statement: A:  Patient presents to skilled OT intervention s/p left shoulder dislocation and rotator cuff tear with decreased mobiilty and strength and increased pain and restrictions.  Pt will benefit from skilled therapeutic intervention in order to improve on the  following deficits: Decreased strength;Increased fascial restricitons;Increased muscle  spasms;Pain;Decreased range of motion Rehab Potential: Good Clinical Impairments Affecting Rehab Potential: motiviation OT Frequency: Min 2X/week OT Duration: 8 weeks OT Treatment/Interventions: Self-care/ADL training;Therapeutic exercise;Modalities;Manual therapy;Therapeutic activities;Patient/family education OT Plan: P:  Skilled OT intervention to decrease deficits and return to prior level of function with all daily activities.  Treatment Plan:  MFR and manual stretching to left shoulder region, PROM, AAROM, progress to AROM.  Scapular stability exercises, ball stretches.    Goals Short Term Goals Time to Complete Short Term Goals: 4 weeks Short Term Goal 1: Patient will be educated on a HEP. Short Term Goal 2: Patient will improve PROM of left shoulder to [facility] in order to wash under arm. Short Term Goal 3: Patient will improve left shoulder strength to 3/5 in order to lift bags of groceries. Short Term Goal 4: Patient will decrease pain in left shoulder to 4/10 or better when using left arm with functional activities.  Short Term Goal 5: Patient will decrease fascial restrictions in left shoulder to min-mod. Long Term Goals Time to Complete Long Term Goals: 8 weeks Long Term Goal 1: Patient will return to prior level of independence with all B/IADLs and leisure activities.  Long Term Goal 2: Patient will have WFL AROM in his left shoulder in order to reach into overhead cabinets.  Long Term Goal 3: Patient will improve left shoulder strength to 4/5 in order to lift bags of groceries. Long Term Goal 4: Patient will decrease pain in left shoulder to 2/10 or better when using left arm with functional activities.  Long Term Goal 5: Patient will decrease fascial restrictions in left shoulder to min.  Problem List Patient Active Problem List   Diagnosis Date Noted  . Closed dislocation of shoulder, unspecified site 11/21/2013  . Left rotator cuff tear 11/21/2013  . Pain in joint, shoulder  region 11/21/2013  . Muscle weakness (generalized) 11/21/2013    End of Session Activity Tolerance: Patient tolerated treatment well General Behavior During Therapy: WFL for tasks assessed/performed OT Plan of Care OT Home Exercise Plan: educated on tband extension and row and towel slides.  patient voiced understanding of HEP. OT Patient Instructions: scanned Consulted and Agree with Plan of Care: Patient  GO Functional Assessment Tool Used: FOTO scored 37 independent  Functional Limitation: Self care Self Care Current Status (Z5638(G8987): At least 60 percent but less than 80 percent impaired, limited or restricted Self Care Goal Status (V5643(G8988): At least 20 percent but less than 40 percent impaired, limited or restricted  Shirlean MylarBethany H. Derotha Fishbaugh, OTR/L [phone]  11/21/2013, 5:04 PM  Physician Documentation Your signature is required to indicate approval of the treatment plan as stated above.  Please sign and either send electronically or make a copy of this report for your files and return this physician signed original.  Please mark one 1.__approve of plan  2. ___approve of plan with the following conditions.   ______________________________                                                          _____________________ Physician Signature  Date  

## 2013-11-26 ENCOUNTER — Ambulatory Visit (HOSPITAL_COMMUNITY)
Admission: RE | Admit: 2013-11-26 | Payer: Medicare Other | Source: Ambulatory Visit | Attending: Orthopedic Surgery | Admitting: Orthopedic Surgery

## 2013-11-28 ENCOUNTER — Ambulatory Visit (HOSPITAL_COMMUNITY): Payer: Medicare Other

## 2013-12-05 ENCOUNTER — Ambulatory Visit (HOSPITAL_COMMUNITY): Admission: RE | Admit: 2013-12-05 | Payer: Medicare Other | Source: Ambulatory Visit

## 2013-12-07 ENCOUNTER — Inpatient Hospital Stay (HOSPITAL_COMMUNITY): Admission: RE | Admit: 2013-12-07 | Payer: Medicare Other | Source: Ambulatory Visit

## 2013-12-07 ENCOUNTER — Telehealth (HOSPITAL_COMMUNITY): Payer: Self-pay | Admitting: Specialist

## 2013-12-07 NOTE — Telephone Encounter (Signed)
Patient came by clinic and stated that he wanted to hold all therapy visits as they irritated his condition rather than helping the condition.  He wants to consult with his MD prior to resuming therapy.  I dc all visits, however will not dc from the system yet.

## 2013-12-10 ENCOUNTER — Ambulatory Visit (HOSPITAL_COMMUNITY): Payer: Medicare Other

## 2013-12-12 ENCOUNTER — Ambulatory Visit (HOSPITAL_COMMUNITY): Payer: Medicare Other

## 2013-12-17 ENCOUNTER — Ambulatory Visit (HOSPITAL_COMMUNITY): Payer: Medicare Other | Admitting: Specialist

## 2013-12-19 ENCOUNTER — Ambulatory Visit (HOSPITAL_COMMUNITY): Payer: Medicare Other | Admitting: Specialist

## 2015-06-04 ENCOUNTER — Encounter (HOSPITAL_COMMUNITY): Payer: Self-pay | Admitting: Emergency Medicine

## 2015-06-04 ENCOUNTER — Emergency Department (HOSPITAL_COMMUNITY)
Admission: EM | Admit: 2015-06-04 | Discharge: 2015-06-04 | Disposition: A | Discharge status: Home / Self Care | Payer: Medicare Other | Attending: Emergency Medicine | Admitting: Emergency Medicine

## 2015-06-04 DIAGNOSIS — F0280 Dementia in other diseases classified elsewhere without behavioral disturbance: Secondary | ICD-10-CM | POA: Diagnosis not present

## 2015-06-04 DIAGNOSIS — Z79899 Other long term (current) drug therapy: Secondary | ICD-10-CM | POA: Insufficient documentation

## 2015-06-04 DIAGNOSIS — G308 Other Alzheimer's disease: Secondary | ICD-10-CM | POA: Insufficient documentation

## 2015-06-04 DIAGNOSIS — Z8719 Personal history of other diseases of the digestive system: Secondary | ICD-10-CM | POA: Diagnosis not present

## 2015-06-04 DIAGNOSIS — G309 Alzheimer's disease, unspecified: Secondary | ICD-10-CM | POA: Diagnosis not present

## 2015-06-04 DIAGNOSIS — Z4802 Encounter for removal of sutures: Secondary | ICD-10-CM | POA: Insufficient documentation

## 2015-06-04 DIAGNOSIS — Z791 Long term (current) use of non-steroidal anti-inflammatories (NSAID): Secondary | ICD-10-CM | POA: Diagnosis not present

## 2015-06-04 DIAGNOSIS — I10 Essential (primary) hypertension: Secondary | ICD-10-CM | POA: Insufficient documentation

## 2015-06-04 DIAGNOSIS — Z87891 Personal history of nicotine dependence: Secondary | ICD-10-CM | POA: Diagnosis not present

## 2015-06-04 DIAGNOSIS — Z8669 Personal history of other diseases of the nervous system and sense organs: Secondary | ICD-10-CM | POA: Insufficient documentation

## 2015-06-04 DIAGNOSIS — Z7901 Long term (current) use of anticoagulants: Secondary | ICD-10-CM | POA: Insufficient documentation

## 2015-06-04 NOTE — ED Notes (Signed)
Patient states he had a biopsy for skin cancer on his right temple and needs the one stitch removed. Patient presents with suture removal kit and states "the doctor told me to just go where I could to get it out. I went to the EMS place but no one was there. Area is clean and dry at triage.

## 2015-06-04 NOTE — Discharge Instructions (Signed)

## 2015-06-04 NOTE — ED Provider Notes (Signed)
CSN: [id number]     Arrival date & time 06/04/15  1102 History   First MD Initiated Contact with Patient 06/04/15 1133     Chief Complaint  Patient presents with  . Suture / Staple Removal     (Consider location/radiation/quality/duration/timing/severity/associated sxs/prior Treatment) Patient is a 82 y.o. male presenting with suture removal. The history is provided by the patient.  Suture / Staple Removal This is a new problem. The current episode started 1 to 4 weeks ago. The problem occurs constantly. The problem has been rapidly improving. Pertinent negatives include no abdominal pain, arthralgias, chest pain, chills, coughing, fever, neck pain or rash. Nothing aggravates the symptoms. Treatments tried: cleanse with soap and water. Neosporin. The treatment provided significant relief.    Past Medical History  Diagnosis Date  . Dementia   . Alzheimer disease   . Hepatic encephalopathy (HCC)   . Liver disease   . Stye   . Hypertension    History reviewed. No pertinent past surgical history. History reviewed. No pertinent family history. Social History  Substance Use Topics  . Smoking status: Former Games developer  . Smokeless tobacco: None  . Alcohol Use: No    Review of Systems  Constitutional: Negative for fever, chills and activity change.       All ROS Neg except as noted in HPI  HENT: Negative for nosebleeds.   Eyes: Negative for photophobia and discharge.  Respiratory: Negative for cough, shortness of breath and wheezing.   Cardiovascular: Negative for chest pain and palpitations.  Gastrointestinal: Negative for abdominal pain and blood in stool.  Genitourinary: Negative for dysuria, frequency and hematuria.  Musculoskeletal: Negative for back pain, arthralgias and neck pain.  Skin: Positive for wound. Negative for rash.  Neurological: Negative for dizziness, seizures and speech difficulty.  Psychiatric/Behavioral: Negative for hallucinations and confusion.       Allergies  Ace inhibitors; Aspirin; and Simvastatin  Home Medications   Prior to Admission medications   Medication Sig Start Date End Date Taking? Authorizing Provider  allopurinol (ZYLOPRIM) 100 MG tablet Take 200 mg by mouth daily. 11/14/12   Historical Provider, MD  atorvastatin (LIPITOR) 20 MG tablet Take 40 mg by mouth daily. 11/10/12   Historical Provider, MD  B Complex Vitamins (VITAMIN-B COMPLEX) TABS Take 1 tablet by mouth daily.    Historical Provider, MD  cetirizine (ZYRTEC) 10 MG tablet Take 10 mg by mouth daily. 04/13/10   Historical Provider, MD  cloNIDine (CATAPRES - DOSED IN MG/24 HR) 0.2 mg/24hr patch Place 0.4 mg onto the skin once a week. 02/06/13   Historical Provider, MD  clopidogrel (PLAVIX) 75 MG tablet Take 75 mg by mouth daily. 02/06/13   Historical Provider, MD  colchicine (COLCRYS) 0.6 MG tablet Take 0.6 mg by mouth daily as needed. 02/21/12   Historical Provider, MD  diclofenac sodium (VOLTAREN) 1 % GEL Apply 1 application topically 2 (two) times daily as needed. 04/30/09   Historical Provider, MD  hydrochlorothiazide (HYDRODIURIL) 25 MG tablet Take 25 mg by mouth daily. 02/06/13   Historical Provider, MD  HYDROcodone-acetaminophen (NORCO/VICODIN) 5-325 MG per tablet Take 2 tablets by mouth every 4 (four) hours as needed. 10/25/13   Rolland Porter, MD  ipratropium (ATROVENT) 0.06 % nasal spray Place 2 sprays into the nose 3 (three) times daily. 08/27/13 08/27/14  Historical Provider, MD  ipratropium-albuterol (DUONEB) 0.5-2.5 (3) MG/3ML SOLN Inhale 1 vial into the lungs 4 (four) times daily as needed. 04/03/12   Historical Provider, MD  metoprolol succinate (  TOPROL-XL) 50 MG 24 hr tablet Take 25 mg by mouth daily. 02/06/13   Historical Provider, MD  nabumetone (RELAFEN) 500 MG tablet Take 1,000 mg by mouth daily. 05/22/10   Historical Provider, MD  nitroGLYCERIN (NITROSTAT) 0.4 MG SL tablet Place 0.4 mg under the tongue as needed. 03/05/13   Historical Provider, MD   Omega-3 Fatty Acids (FISH OIL) 1000 MG CAPS Take 1 capsule by mouth daily. 02/06/13   Historical Provider, MD  ranitidine (ZANTAC) 75 MG tablet Take 75 mg by mouth daily. 04/13/10   Historical Provider, MD  Travoprost, BAK Free, (TRAVATAN Z) 0.004 % SOLN ophthalmic solution Apply 1 drop to eye at bedtime. 08/28/13   Historical Provider, MD   BP 175/92 mmHg  Pulse 87  Temp(Src) 97.5 F (36.4 C) (Oral)  Resp 17  Ht  (1.854 m)  Wt 84.369 kg  BMI 24.55 kg/m2  SpO2 99% Physical Exam  Constitutional: He is oriented to person, place, and time. He appears well-developed and well-nourished.  Non-toxic appearance.  HENT:  Head: Normocephalic.  Right Ear: Tympanic membrane and external ear normal.  Left Ear: Tympanic membrane and external ear normal.  Patient had a punch type biopsy for skin cancer in the right temporal area. He has a stitch in place. No drainage, no red streaks, or signs of infection.  Eyes: EOM and lids are normal. Pupils are equal, round, and reactive to light.  Neck: Normal range of motion. Neck supple. Carotid bruit is not present.  Cardiovascular: Normal rate, regular rhythm, normal heart sounds, intact distal pulses and normal pulses.   Pulmonary/Chest: Breath sounds normal. No respiratory distress.  Abdominal: Soft. Bowel sounds are normal. There is no tenderness. There is no guarding.  Musculoskeletal: Normal range of motion.  Lymphadenopathy:       Head (right side): No submandibular adenopathy present.       Head (left side): No submandibular adenopathy present.    He has no cervical adenopathy.  Neurological: He is alert and oriented to person, place, and time. He has normal strength. No cranial nerve deficit or sensory deficit.  Skin: Skin is warm and dry.  Psychiatric: He has a normal mood and affect. His speech is normal.  Nursing note and vitals reviewed.   ED Course  Procedures (including critical care time) Labs Review Labs Reviewed - No data to  display  Imaging Review No results found. I have personally reviewed and evaluated these images and lab results as part of my medical decision-making.   EKG Interpretation None      MDM patient had a punch type biopsy for skin cancer at the right temple. He had a stitch put in. The patient is on Plavix, but has not had any problems with bleeding. There's been no signs of infection. The suture was removed by nursing staff without problem. The area looks good. It is safe for the patient to be discharged home. The patient will see the primary physician, or return to the emergency department if any signs of infection, or other problems.    Final diagnoses:  Visit for suture removal    *I have reviewed nursing notes, vital signs, and all appropriate lab and imaging results for this patient.Ivery Quale, PA-C 06/04/15 1141  Marily Memos, MD 06/05/15 [phone]

## 2016-03-23 IMAGING — CR DG HUMERUS 2V *L*
2 series · 2 of 2 positions shown · non-contrast
Comparison: None.

CLINICAL DATA: Fall.  Pain in left shoulder.

EXAM:
LEFT HUMERUS - 2+ VIEW

[view not recorded (1 of 2)]
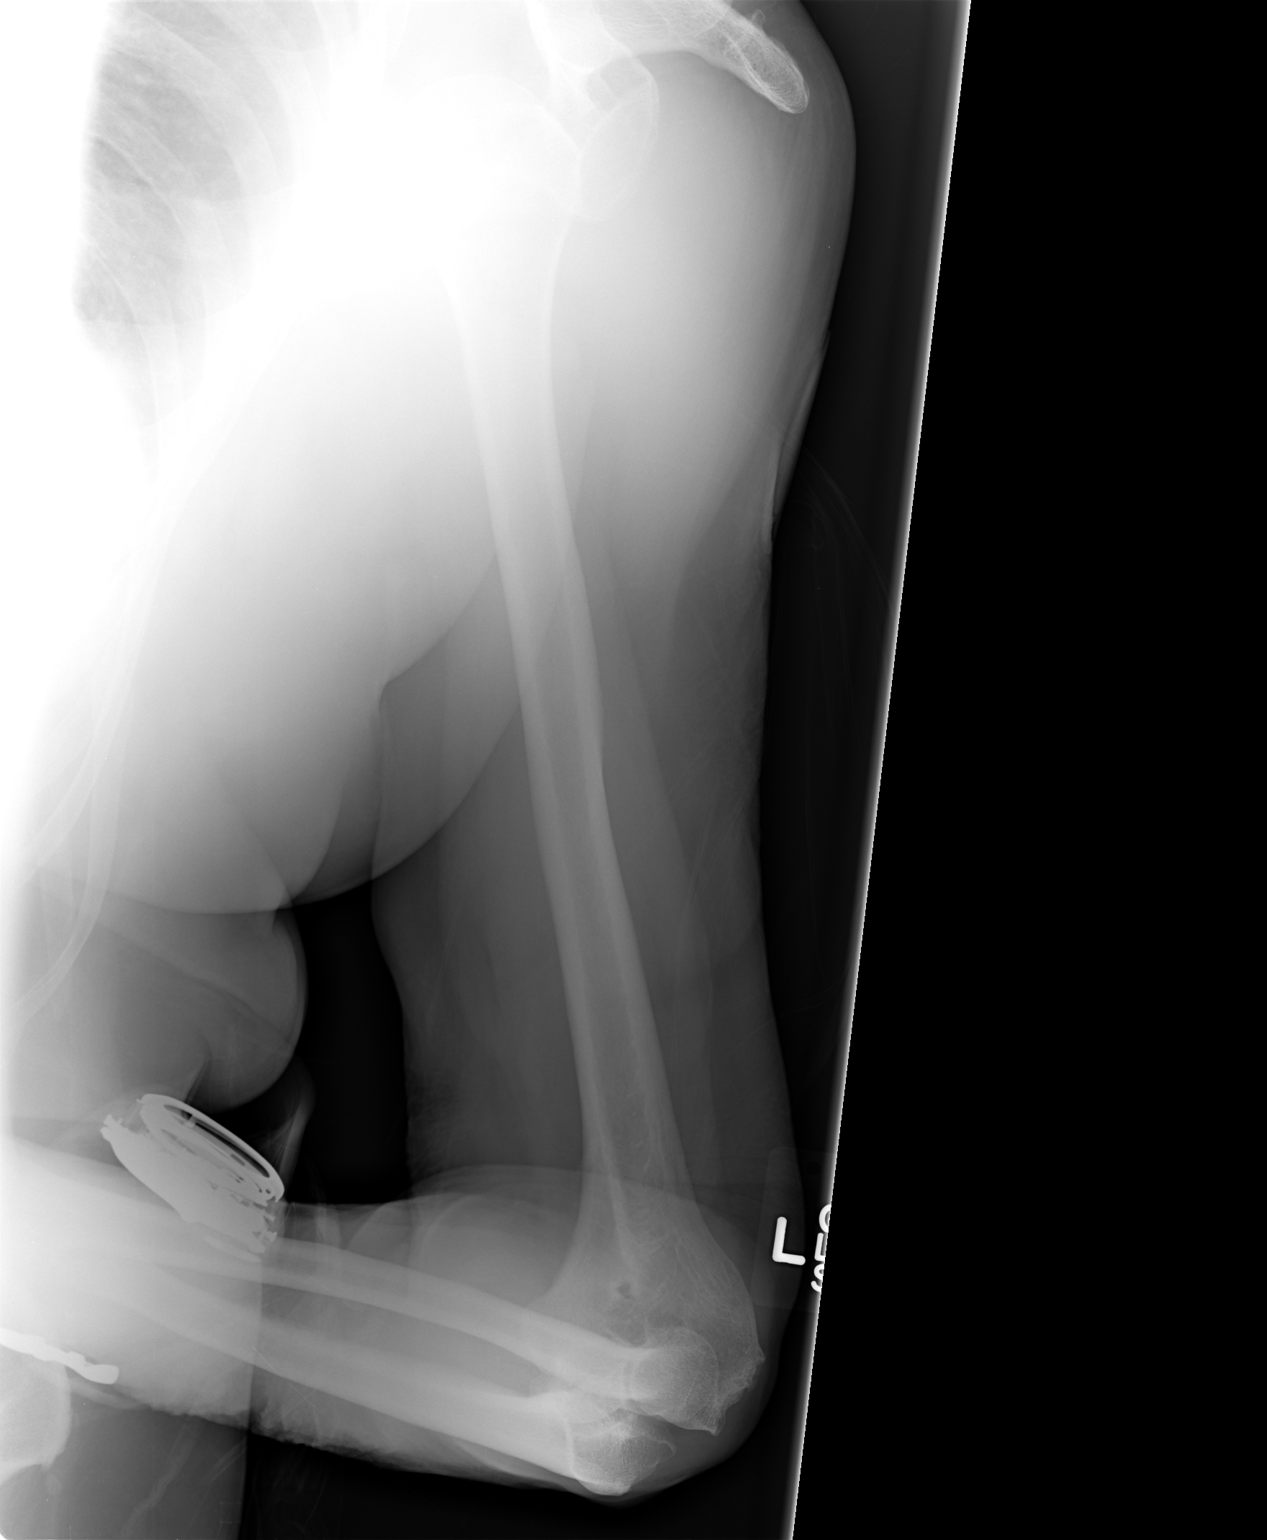

[view not recorded (2 of 2)]
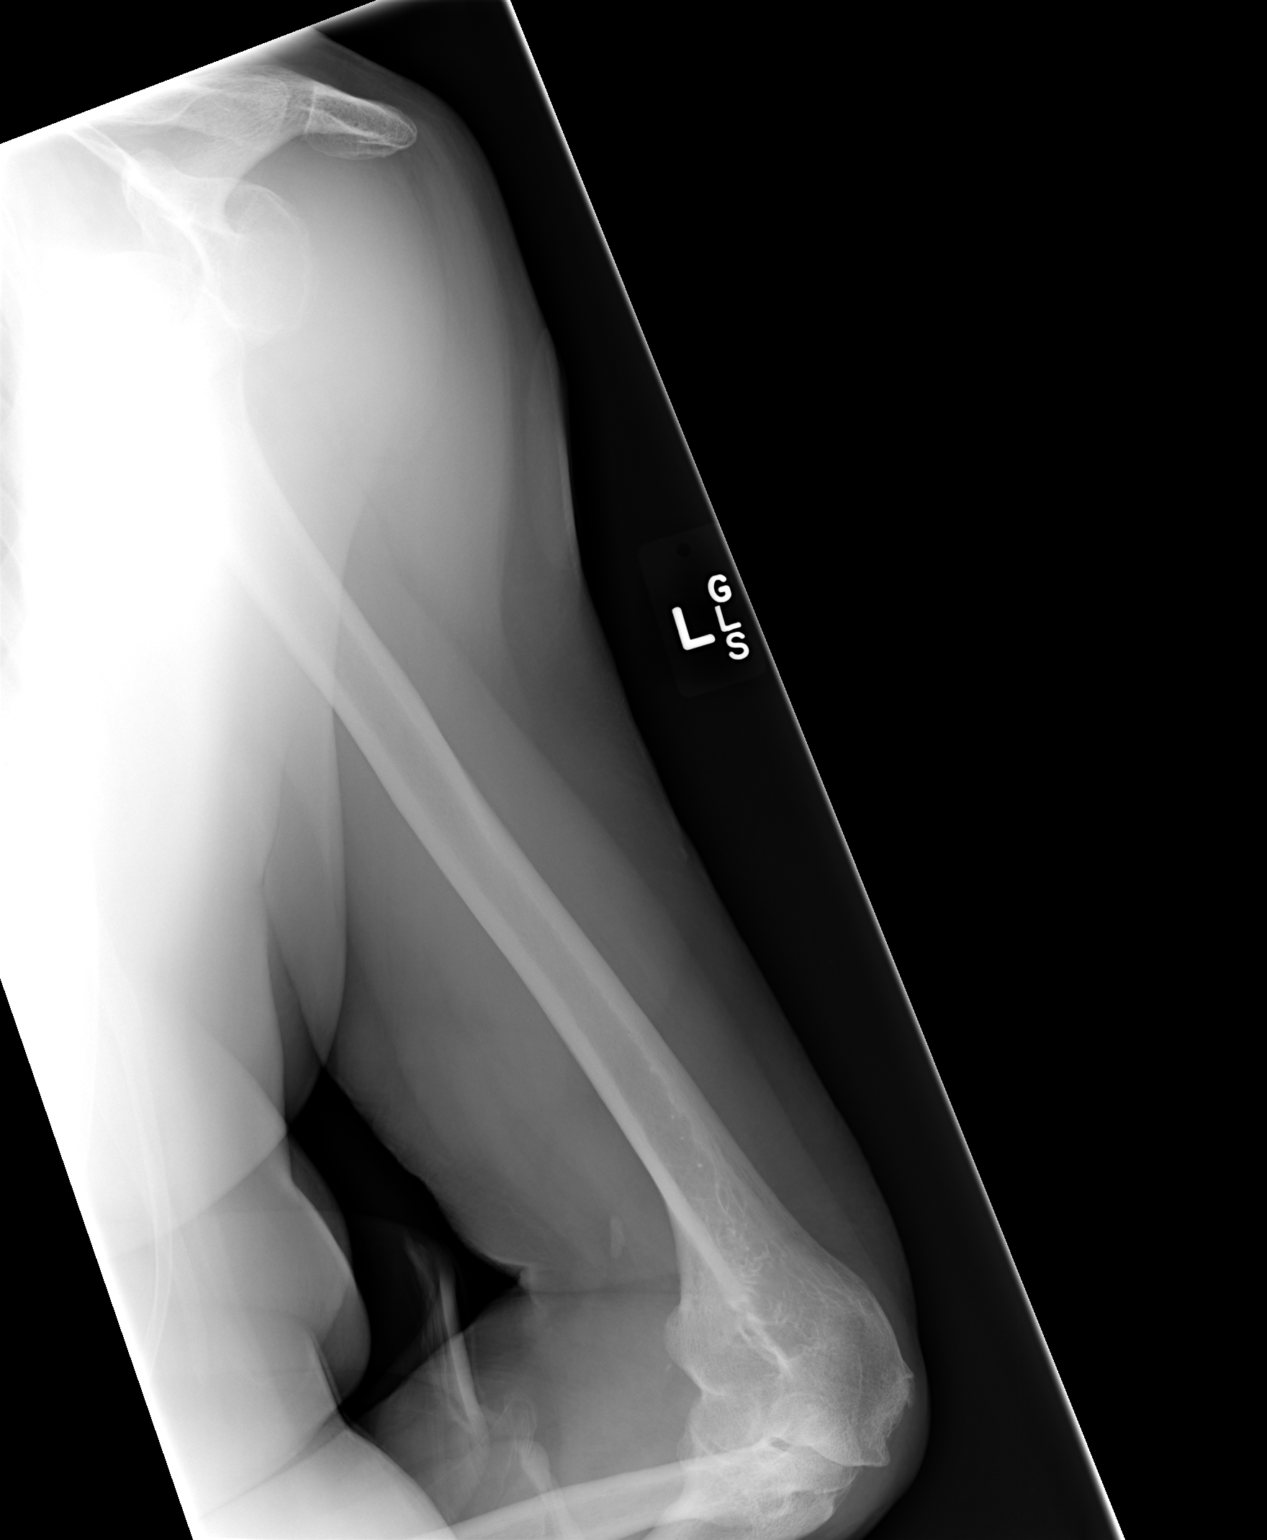

[2 of 2 positions shown; findings below may reference images not displayed]

FINDINGS: There is anterior dislocation of the glenohumeral joint. No
fractures are identified.
IMPRESSION: 1. Anterior dislocation of glenohumeral joint.

## 2023-07-07 ENCOUNTER — Emergency Department
Admission: EM | Admit: 2023-07-07 | Discharge: 2023-07-08 | Disposition: A | Discharge status: Home / Self Care | Attending: Emergency Medicine | Admitting: Emergency Medicine

## 2023-07-07 ENCOUNTER — Emergency Department

## 2023-07-07 ENCOUNTER — Other Ambulatory Visit: Payer: Self-pay

## 2023-07-07 ENCOUNTER — Encounter: Payer: Self-pay | Admitting: *Deleted

## 2023-07-07 DIAGNOSIS — R2689 Other abnormalities of gait and mobility: Secondary | ICD-10-CM | POA: Diagnosis not present

## 2023-07-07 DIAGNOSIS — R0789 Other chest pain: Secondary | ICD-10-CM | POA: Insufficient documentation

## 2023-07-07 DIAGNOSIS — I10 Essential (primary) hypertension: Secondary | ICD-10-CM | POA: Insufficient documentation

## 2023-07-07 DIAGNOSIS — F0390 Unspecified dementia without behavioral disturbance: Secondary | ICD-10-CM | POA: Diagnosis not present

## 2023-07-07 DIAGNOSIS — Y92410 Unspecified street and highway as the place of occurrence of the external cause: Secondary | ICD-10-CM | POA: Diagnosis not present

## 2023-07-07 LAB — COMPREHENSIVE METABOLIC PANEL
ALT: 19 U/L (ref 0–44)
AST: 25 U/L (ref 15–41)
Albumin: 3.6 g/dL (ref 3.5–5.0)
Alkaline Phosphatase: 95 U/L (ref 38–126)
Anion gap: 9 (ref 5–15)
BUN: 31 mg/dL — ABNORMAL HIGH (ref 8–23)
CO2: 24 mmol/L (ref 22–32)
Calcium: 9.7 mg/dL (ref 8.9–10.3)
Chloride: 109 mmol/L (ref 98–111)
Creatinine, Ser: 1.24 mg/dL (ref 0.61–1.24)
GFR, Estimated: 55 mL/min — ABNORMAL LOW (ref >60)
Glucose, Bld: 129 mg/dL — ABNORMAL HIGH (ref 70–99)
Potassium: 3.8 mmol/L (ref 3.5–5.1)
Sodium: 142 mmol/L (ref 135–145)
Total Bilirubin: 0.7 mg/dL (ref 0.0–1.2)
Total Protein: 6.6 g/dL (ref 6.5–8.1)

## 2023-07-07 LAB — CBC WITH DIFFERENTIAL/PLATELET
Abs Immature Granulocytes: 0.06 10*3/uL (ref 0.00–0.07)
Basophils Absolute: 0.1 10*3/uL (ref 0.0–0.1)
Basophils Relative: 1 %
Eosinophils Absolute: 0.1 10*3/uL (ref 0.0–0.5)
Eosinophils Relative: 1 %
HCT: 40.5 % (ref 39.0–52.0)
Hemoglobin: 13.8 g/dL (ref 13.0–17.0)
Immature Granulocytes: 1 %
Lymphocytes Relative: 11 %
Lymphs Abs: 1 10*3/uL (ref 0.7–4.0)
MCH: 30.2 pg (ref 26.0–34.0)
MCHC: 34.1 g/dL (ref 30.0–36.0)
MCV: 88.6 fL (ref 80.0–100.0)
Monocytes Absolute: 0.4 10*3/uL (ref 0.1–1.0)
Monocytes Relative: 5 %
Neutro Abs: 7.7 10*3/uL (ref 1.7–7.7)
Neutrophils Relative %: 81 %
Platelets: 192 10*3/uL (ref 150–400)
RBC: 4.57 MIL/uL (ref 4.22–5.81)
RDW: 12.6 % (ref 11.5–15.5)
WBC: 9.3 10*3/uL (ref 4.0–10.5)
nRBC: 0 % (ref 0.0–0.2)

## 2023-07-07 LAB — ETHANOL: Alcohol, Ethyl (B): <10 mg/dL (ref <10)
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
## 2023-07-07 NOTE — ED Notes (Signed)
 Pt alert, waiting on ct scan reports.

## 2023-07-07 NOTE — ED Notes (Signed)
 Pt was provided a snack and drink.
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
## 2023-07-07 NOTE — ED Notes (Signed)
 Pt has a black walker at nurses desk with a green tag on it.

## 2023-07-07 NOTE — ED Notes (Signed)
 Pt in ct scan

## 2023-07-08 DIAGNOSIS — R0789 Other chest pain: Secondary | ICD-10-CM | POA: Diagnosis not present

## 2023-07-08 MED ORDER — IBUPROFEN 600 MG PO TABS
600.0000 mg | ORAL_TABLET | Freq: Once | ORAL | Status: AC
  Administered 2023-07-08: 600 mg via ORAL
  Filled 2023-07-08: qty 1
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
## 2023-07-08 NOTE — ED Notes (Signed)
 Per Child psychotherapist, they will be arranging transportation for patient back to address he provided.

## 2023-07-08 NOTE — ED Notes (Signed)
 [employer] here, patient escorted to cab by staff. All belongings sent with patient at this time.
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
## 2023-07-08 NOTE — ED Notes (Signed)
 Patient states to this RN "I'm having pain from my navel and up. I need some ibuprofen". MD Wells notified. See orders.

## 2023-07-08 NOTE — ED Notes (Signed)
 Hospital meal provided.  100% consumed, pt tolerated w/o complaints.  Waste discarded appropriately.

## 2023-07-08 NOTE — ED Notes (Signed)
 Pt unable to provide address of home or person of contact to call for ride home.

## 2023-10-31 ENCOUNTER — Emergency Department (HOSPITAL_COMMUNITY)

## 2023-10-31 ENCOUNTER — Emergency Department (HOSPITAL_COMMUNITY)
Admission: EM | Admit: 2023-10-31 | Discharge: 2023-10-31 | Disposition: A | Discharge status: Home / Self Care | Attending: Emergency Medicine | Admitting: Emergency Medicine

## 2023-10-31 ENCOUNTER — Other Ambulatory Visit: Payer: Self-pay

## 2023-10-31 ENCOUNTER — Encounter (HOSPITAL_COMMUNITY): Payer: Self-pay | Admitting: Emergency Medicine

## 2023-10-31 DIAGNOSIS — F0280 Dementia in other diseases classified elsewhere without behavioral disturbance: Secondary | ICD-10-CM | POA: Insufficient documentation

## 2023-10-31 DIAGNOSIS — G309 Alzheimer's disease, unspecified: Secondary | ICD-10-CM | POA: Insufficient documentation

## 2023-10-31 DIAGNOSIS — R531 Weakness: Secondary | ICD-10-CM | POA: Diagnosis not present

## 2023-10-31 DIAGNOSIS — K5730 Diverticulosis of large intestine without perforation or abscess without bleeding: Secondary | ICD-10-CM | POA: Insufficient documentation

## 2023-10-31 DIAGNOSIS — I700 Atherosclerosis of aorta: Secondary | ICD-10-CM | POA: Insufficient documentation

## 2023-10-31 DIAGNOSIS — T675XXA Heat exhaustion, unspecified, initial encounter: Secondary | ICD-10-CM | POA: Diagnosis present

## 2023-10-31 DIAGNOSIS — N200 Calculus of kidney: Secondary | ICD-10-CM | POA: Diagnosis not present

## 2023-10-31 DIAGNOSIS — I2510 Atherosclerotic heart disease of native coronary artery without angina pectoris: Secondary | ICD-10-CM | POA: Diagnosis not present

## 2023-10-31 DIAGNOSIS — E860 Dehydration: Secondary | ICD-10-CM | POA: Diagnosis not present

## 2023-10-31 LAB — URINALYSIS, ROUTINE W REFLEX MICROSCOPIC
Bilirubin Urine: NEGATIVE
Glucose, UA: NEGATIVE mg/dL
Hgb urine dipstick: NEGATIVE
Ketones, ur: NEGATIVE mg/dL
Leukocytes,Ua: NEGATIVE
Nitrite: NEGATIVE
Protein, ur: NEGATIVE mg/dL
Specific Gravity, Urine: 1.008 (ref 1.005–1.030)
pH: 7 (ref 5.0–8.0)

## 2023-10-31 LAB — COMPREHENSIVE METABOLIC PANEL WITH GFR
ALT: 19 U/L (ref 0–44)
AST: 27 U/L (ref 15–41)
Albumin: 3.3 g/dL — ABNORMAL LOW (ref 3.5–5.0)
Alkaline Phosphatase: 102 U/L (ref 38–126)
Anion gap: 12 (ref 5–15)
BUN: 24 mg/dL — ABNORMAL HIGH (ref 8–23)
CO2: 25 mmol/L (ref 22–32)
Calcium: 9.2 mg/dL (ref 8.9–10.3)
Chloride: 103 mmol/L (ref 98–111)
Creatinine, Ser: 1.34 mg/dL — ABNORMAL HIGH (ref 0.61–1.24)
GFR, Estimated: 49 mL/min — ABNORMAL LOW (ref >60)
Glucose, Bld: 177 mg/dL — ABNORMAL HIGH (ref 70–99)
Potassium: 3.9 mmol/L (ref 3.5–5.1)
Sodium: 140 mmol/L (ref 135–145)
Total Bilirubin: 0.5 mg/dL (ref 0.0–1.2)
Total Protein: 6.3 g/dL — ABNORMAL LOW (ref 6.5–8.1)

## 2023-10-31 LAB — CBC WITH DIFFERENTIAL/PLATELET
Abs Immature Granulocytes: 0.08 K/uL — ABNORMAL HIGH (ref 0.00–0.07)
Basophils Absolute: 0 K/uL (ref 0.0–0.1)
Basophils Relative: 0 %
Eosinophils Absolute: 0.3 K/uL (ref 0.0–0.5)
Eosinophils Relative: 2 %
HCT: 40.1 % (ref 39.0–52.0)
Hemoglobin: 13.6 g/dL (ref 13.0–17.0)
Immature Granulocytes: 1 %
Lymphocytes Relative: 6 %
Lymphs Abs: 0.9 K/uL (ref 0.7–4.0)
MCH: 30.5 pg (ref 26.0–34.0)
MCHC: 33.9 g/dL (ref 30.0–36.0)
MCV: 89.9 fL (ref 80.0–100.0)
Monocytes Absolute: 1 K/uL (ref 0.1–1.0)
Monocytes Relative: 7 %
Neutro Abs: 12 K/uL — ABNORMAL HIGH (ref 1.7–7.7)
Neutrophils Relative %: 84 %
Platelets: 165 K/uL (ref 150–400)
RBC: 4.46 MIL/uL (ref 4.22–5.81)
RDW: 13.2 % (ref 11.5–15.5)
WBC: 14.3 K/uL — ABNORMAL HIGH (ref 4.0–10.5)
nRBC: 0 % (ref 0.0–0.2)

## 2023-10-31 LAB — TROPONIN I (HIGH SENSITIVITY)
Troponin I (High Sensitivity): 7 ng/L (ref <18)
Troponin I (High Sensitivity): 10 ng/L (ref <18)

## 2023-10-31 LAB — MAGNESIUM: Magnesium: 1.8 mg/dL (ref 1.7–2.4)

## 2023-10-31 MED ORDER — LACTATED RINGERS IV BOLUS
500.0000 mL | Freq: Once | INTRAVENOUS | Status: AC
  Administered 2023-10-31: 500 mL via INTRAVENOUS

## 2023-10-31 MED ORDER — METOPROLOL SUCCINATE ER 50 MG PO TB24
50.0000 mg | ORAL_TABLET | Freq: Every day | ORAL | Status: DC
  Administered 2023-10-31: 50 mg via ORAL
  Filled 2023-10-31: qty 1

## 2023-10-31 MED ORDER — IOHEXOL 350 MG/ML SOLN
80.0000 mL | Freq: Once | INTRAVENOUS | Status: AC | PRN
Start: 2023-10-31 — End: 2023-10-31
  Administered 2023-10-31: 80 mL via INTRAVENOUS

## 2023-10-31 MED ORDER — LACTATED RINGERS IV BOLUS
1000.0000 mL | Freq: Once | INTRAVENOUS | Status: AC
  Administered 2023-10-31: 1000 mL via INTRAVENOUS

## 2023-10-31 NOTE — ED Triage Notes (Signed)
 Pt bib rcems for heat exposure for 1-2hours. Pt was working in his yard, felt weak and set down. Then he called 911 to bring him to the ED

## 2023-10-31 NOTE — ED Notes (Signed)
 Pt transported home via transport crew [facility]. Pt A&Ox4, verbalized understanding of d/c instructions and follow up care.

## 2023-10-31 NOTE — ED Notes (Signed)
 The patient was able to take a few steps around the room with a walker.

## 2023-10-31 NOTE — ED Provider Notes (Addendum)
 [place] [facility] DEPARTMENT AT [facility] Provider Note   CSN: [id number] Arrival date & time: 10/31/23  1625     Patient presents with: Heat Exposure   Joseph Delgado is a 89 y.o. male.   HPI Patient presents for generalized weakness.  Medical history includes Alzheimer's dementia, HTN.  He lives independently.  He states that he was in his normal state of health earlier today.  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  Today, he will outside to work on a septic tank.  While in the sun, he began to feel weak.  He ease himself to the ground and crawled to some area of shade.  He called 911.  Patient was given IV fluids prior to arrival.  He states that his generalized weakness has improved but he still does not feel quite back to normal.  He denies any current or recent areas of discomfort.  At baseline, he ambulates with a walker.    Prior to Admission medications   Medication Sig Start Date End Date Taking? Authorizing Provider  allopurinol (ZYLOPRIM) 100 MG tablet Take 200 mg by mouth daily. 11/14/12   [provider]  atorvastatin (LIPITOR) 40 MG tablet Take 40 mg by mouth daily.    [provider]  B Complex Vitamins (VITAMIN-B COMPLEX) TABS Take 1 tablet by mouth daily.    [provider]  cetirizine (ZYRTEC) 10 MG tablet Take 10 mg by mouth daily. 04/13/10   [provider]  cloNIDine (CATAPRES - DOSED IN MG/24 HR) 0.2 mg/24hr patch Place 0.4 mg onto the skin once a week. 02/06/13   [provider]  clopidogrel (PLAVIX) 75 MG tablet Take 75 mg by mouth daily. 02/06/13   [provider]  colchicine (COLCRYS) 0.6 MG tablet Take 0.6 mg by mouth daily as needed. 02/21/12   [provider]  diclofenac sodium (VOLTAREN) 1 % GEL Apply 1 application topically 2 (two) times daily as needed. 04/30/09   [provider]  gabapentin (NEURONTIN) 100 MG  capsule Take 100 mg by mouth 3 (three) times daily.    [provider]  hydrochlorothiazide (HYDRODIURIL) 12.5 MG tablet Take 12.5 mg by mouth daily.    [provider]  HYDROcodone -acetaminophen  (NORCO/VICODIN) 5-325 MG per tablet Take 2 tablets by mouth every 4 (four) hours as needed. 10/25/13   Lynwood Anes, MD  ipratropium (ATROVENT) 0.06 % nasal spray Place 2 sprays into the nose 3 (three) times daily. 08/27/13 08/27/14  [provider]  ipratropium-albuterol (DUONEB) 0.5-2.5 (3) MG/3ML SOLN Inhale 1 vial into the lungs 4 (four) times daily as needed. 04/03/12   [provider]  metoprolol  succinate (TOPROL -XL) 50 MG 24 hr tablet Take 25 mg by mouth daily. 02/06/13   [provider]  nabumetone (RELAFEN) 500 MG tablet Take 1,000 mg by mouth daily. 05/22/10   [provider]  nitroGLYCERIN (NITROSTAT) 0.4 MG SL tablet Place 0.4 mg under the tongue as needed. 03/05/13   [provider]  omega-3 acid ethyl esters (LOVAZA) 1 g capsule Take 1 capsule by mouth daily.    [provider]  Omega-3 Fatty Acids (FISH OIL) 1000 MG CAPS Take 1 capsule by mouth daily. 02/06/13   [provider]  ranitidine (ZANTAC) 75 MG tablet Take 75 mg by mouth daily. 04/13/10   [provider]  Travoprost, BAK Free, (TRAVATAN Z) 0.004 % SOLN ophthalmic  solution Apply 1 drop to eye at bedtime. 08/28/13   [provider]    Allergies: Angiotensin receptor blockers, Aspirin, Simvastatin, Ace inhibitors, and Naproxen sodium    Review of Systems  Constitutional:  Positive for fatigue.  Neurological:  Positive for weakness (Generalized).  All other systems reviewed and are negative.   Updated Vital Signs BP 120/82   Pulse (!) 103   Temp 97.8 F (36.6 C) (Oral)   Resp 16   Ht 6' (1.829 m)   Wt 75 kg   SpO2 92%   BMI 22.42 kg/m   Physical Exam Vitals and nursing note reviewed.  Constitutional:      General: He is not in  acute distress.    Appearance: Normal appearance. He is well-developed. He is not ill-appearing, toxic-appearing or diaphoretic.  HENT:     Head: Normocephalic and atraumatic.     Right Ear: External ear normal.     Left Ear: External ear normal.     Nose: Nose normal.  Eyes:     Extraocular Movements: Extraocular movements intact.     Conjunctiva/sclera: Conjunctivae normal.  Cardiovascular:     Rate and Rhythm: Normal rate and regular rhythm.  Pulmonary:     Effort: Pulmonary effort is normal. No respiratory distress.     Breath sounds: Normal breath sounds. No wheezing or rales.  Abdominal:     General: There is no distension.     Palpations: Abdomen is soft.     Tenderness: There is no abdominal tenderness.  Musculoskeletal:        General: No swelling. Normal range of motion.     Cervical back: Normal range of motion and neck supple.     Right lower leg: No edema.     Left lower leg: No edema.  Skin:    General: Skin is warm and dry.     Coloration: Skin is not jaundiced or pale.  Neurological:     General: No focal deficit present.     Mental Status: He is alert and oriented to person, place, and time.     Cranial Nerves: No cranial nerve deficit.     Sensory: No sensory deficit.     Motor: No weakness.     Coordination: Coordination normal.  Psychiatric:        Mood and Affect: Mood normal.        Behavior: Behavior normal.     (all labs ordered are listed, but only abnormal results are displayed) Labs Reviewed  COMPREHENSIVE METABOLIC PANEL WITH GFR - Abnormal; Notable for the following components:      Result Value   Glucose, Bld 177 (*)    BUN 24 (*)    Creatinine, Ser 1.34 (*)    Total Protein 6.3 (*)    Albumin 3.3 (*)    GFR, Estimated 49 (*)    All other components within normal limits  CBC WITH DIFFERENTIAL/PLATELET - Abnormal; Notable for the following components:   WBC 14.3 (*)    Neutro Abs 12.0 (*)    Abs Immature Granulocytes 0.08 (*)    All  other components within normal limits  URINALYSIS, ROUTINE W REFLEX MICROSCOPIC  MAGNESIUM  CBC WITH DIFFERENTIAL/PLATELET  CBC WITH DIFFERENTIAL/PLATELET  CBG MONITORING, ED  TROPONIN I (HIGH SENSITIVITY)  TROPONIN I (HIGH SENSITIVITY)    EKG: EKG Interpretation Date/Time:  Monday October 31 2023 16:37:46 EDT Ventricular Rate:  115 PR Interval:  154 QRS Duration:  93 QT Interval:  352  QTC Calculation: 487 R Axis:   54  Text Interpretation: Sinus or ectopic atrial tachycardia Borderline prolonged QT interval Confirmed by Melvenia Motto ([id number]) on 10/31/2023 6:00:02 PM  Radiology: CT Angio Chest PE W and/or Wo Contrast Result Date: 10/31/2023 CLINICAL DATA:  PE suspected, unintended weight loss, heat exposure * Tracking Code: BO * EXAM: CT ANGIOGRAPHY CHEST CT ABDOMEN AND PELVIS WITH CONTRAST TECHNIQUE: Multidetector CT imaging of the chest was performed using the standard protocol during bolus administration of intravenous contrast. Multiplanar CT image reconstructions and MIPs were obtained to evaluate the vascular anatomy. Multidetector CT imaging of the abdomen and pelvis was performed using the standard protocol during bolus administration of intravenous contrast. RADIATION DOSE REDUCTION: This exam was performed according to the departmental dose-optimization program which includes automated exposure control, adjustment of the mA and/or kV according to patient size and/or use of iterative reconstruction technique. CONTRAST:  80mL OMNIPAQUE  IOHEXOL  350 MG/ML SOLN COMPARISON:  None Available. FINDINGS: CT CHEST ANGIOGRAM FINDINGS Cardiovascular: Satisfactory opacification of the pulmonary arteries to the segmental level. No evidence of pulmonary embolism. Normal heart size. Three-vessel coronary artery calcifications. No pericardial effusion. Aortic atherosclerosis. Mediastinum/Nodes: No enlarged mediastinal, hilar, or axillary lymph nodes. Thyroid gland, trachea, and esophagus demonstrate no  significant findings. Lungs/Pleura: Mild bibasilar scarring or atelectasis. No pleural effusion or pneumothorax. Musculoskeletal: No chest wall abnormality. No acute osseous findings. Review of the MIP images confirms the above findings. CT ABDOMEN PELVIS FINDINGS Hepatobiliary: No solid liver abnormality is seen. No gallstones, gallbladder wall thickening, or biliary dilatation. Pancreas: Unremarkable. No pancreatic ductal dilatation or surrounding inflammatory changes. Spleen: Normal in size without significant abnormality. Adrenals/Urinary Tract: Adrenal glands are unremarkable. Small nonobstructive calculus of the inferior pole of the right kidney. No left-sided calculi, ureteral calculi, or hydronephrosis. Parapelvic and renal cortical cysts, benign, requiring no further follow-up or characterization. Bladder is unremarkable. Stomach/Bowel: Stomach is within normal limits. Appendix appears normal. No evidence of bowel wall thickening, distention, or inflammatory changes. Sigmoid diverticulosis. Vascular/Lymphatic: Aortic atherosclerosis. No enlarged abdominal or pelvic lymph nodes. Reproductive: Mild prostatomegaly. Other: No abdominal wall hernia or abnormality. No ascites. Musculoskeletal: No acute or significant osseous findings. IMPRESSION: 1. Negative examination for pulmonary embolism. 2. No acute CT findings of the chest, abdomen, or pelvis. 3. Coronary artery disease. 4. Small nonobstructive calculus of the inferior pole of the right kidney. No left-sided calculi, ureteral calculi, or hydronephrosis. 5. Sigmoid diverticulosis without evidence of acute diverticulitis. Aortic Atherosclerosis (ICD10-I70.0). Electronically Signed   By: Marolyn JONETTA Jaksch M.D.   On: 10/31/2023 20:22   CT ABDOMEN PELVIS W CONTRAST Result Date: 10/31/2023 CLINICAL DATA:  PE suspected, unintended weight loss, heat exposure * Tracking Code: BO * EXAM: CT ANGIOGRAPHY CHEST CT ABDOMEN AND PELVIS WITH CONTRAST TECHNIQUE: Multidetector  CT imaging of the chest was performed using the standard protocol during bolus administration of intravenous contrast. Multiplanar CT image reconstructions and MIPs were obtained to evaluate the vascular anatomy. Multidetector CT imaging of the abdomen and pelvis was performed using the standard protocol during bolus administration of intravenous contrast. RADIATION DOSE REDUCTION: This exam was performed according to the departmental dose-optimization program which includes automated exposure control, adjustment of the mA and/or kV according to patient size and/or use of iterative reconstruction technique. CONTRAST:  80mL OMNIPAQUE  IOHEXOL  350 MG/ML SOLN COMPARISON:  None Available. FINDINGS: CT CHEST ANGIOGRAM FINDINGS Cardiovascular: Satisfactory opacification of the pulmonary arteries to the segmental level. No evidence of pulmonary embolism. Normal heart size. Three-vessel coronary artery calcifications. No  pericardial effusion. Aortic atherosclerosis. Mediastinum/Nodes: No enlarged mediastinal, hilar, or axillary lymph nodes. Thyroid gland, trachea, and esophagus demonstrate no significant findings. Lungs/Pleura: Mild bibasilar scarring or atelectasis. No pleural effusion or pneumothorax. Musculoskeletal: No chest wall abnormality. No acute osseous findings. Review of the MIP images confirms the above findings. CT ABDOMEN PELVIS FINDINGS Hepatobiliary: No solid liver abnormality is seen. No gallstones, gallbladder wall thickening, or biliary dilatation. Pancreas: Unremarkable. No pancreatic ductal dilatation or surrounding inflammatory changes. Spleen: Normal in size without significant abnormality. Adrenals/Urinary Tract: Adrenal glands are unremarkable. Small nonobstructive calculus of the inferior pole of the right kidney. No left-sided calculi, ureteral calculi, or hydronephrosis. Parapelvic and renal cortical cysts, benign, requiring no further follow-up or characterization. Bladder is unremarkable.  Stomach/Bowel: Stomach is within normal limits. Appendix appears normal. No evidence of bowel wall thickening, distention, or inflammatory changes. Sigmoid diverticulosis. Vascular/Lymphatic: Aortic atherosclerosis. No enlarged abdominal or pelvic lymph nodes. Reproductive: Mild prostatomegaly. Other: No abdominal wall hernia or abnormality. No ascites. Musculoskeletal: No acute or significant osseous findings. IMPRESSION: 1. Negative examination for pulmonary embolism. 2. No acute CT findings of the chest, abdomen, or pelvis. 3. Coronary artery disease. 4. Small nonobstructive calculus of the inferior pole of the right kidney. No left-sided calculi, ureteral calculi, or hydronephrosis. 5. Sigmoid diverticulosis without evidence of acute diverticulitis. Aortic Atherosclerosis (ICD10-I70.0). Electronically Signed   By: Marolyn JONETTA Jaksch M.D.   On: 10/31/2023 20:22   CT HEAD WO CONTRAST Result Date: 10/31/2023 CLINICAL DATA:  Syncope/presyncope.  Heat exposure for 1-2 hours. EXAM: CT HEAD WITHOUT CONTRAST TECHNIQUE: Contiguous axial images were obtained from the base of the skull through the vertex without intravenous contrast. RADIATION DOSE REDUCTION: This exam was performed according to the departmental dose-optimization program which includes automated exposure control, adjustment of the mA and/or kV according to patient size and/or use of iterative reconstruction technique. COMPARISON:  CT head 07/07/2023. FINDINGS: Brain: No acute intracranial hemorrhage. No CT evidence of acute infarct. Nonspecific hypoattenuation in the periventricular and subcortical white matter favored to reflect chronic microvascular ischemic changes. Mild parenchymal volume loss. No edema, mass effect, or midline shift. The basilar cisterns are patent. Ventricles: The ventricles are normal. Vascular: Atherosclerotic calcifications of the carotid siphons and intracranial vertebral arteries. No hyperdense vessel. Skull: No acute or aggressive  finding. Orbits: Bilateral lens replacement. Sinuses: Complete opacification of the right maxillary sinus with thickening of the sinus walls suggestive of mucoperiosteal reaction. Mild mucosal thickening in the ethmoid sinuses. Mucous retention cyst in the left maxillary sinus. Other: Mastoid air cells are clear. IMPRESSION: No CT evidence of acute intracranial abnormality. Chronic microvascular ischemic changes and mild parenchymal volume loss. Chronic right maxillary sinusitis. Electronically Signed   By: Donnice Mania M.D.   On: 10/31/2023 17:59   DG Chest Port 1 View Result Date: 10/31/2023 CLINICAL DATA:  Generalized weakness EXAM: PORTABLE CHEST - 1 VIEW COMPARISON:  July 07, 2023 FINDINGS: No focal airspace consolidation, pleural effusion, or pneumothorax. No cardiomegaly. Tortuous aorta with aortic atherosclerosis. No acute fracture or destructive lesion. Multilevel thoracic osteophytosis. Dextrocurvature of the thoracic spine. Diffuse osteopenia. Superior subluxation of both humeral heads. IMPRESSION: No acute cardiopulmonary abnormality. Electronically Signed   By: Rogelia Myers M.D.   On: 10/31/2023 17:18     Procedures   Medications Ordered in the ED  metoprolol  succinate (TOPROL -XL) 24 hr tablet 50 mg (50 mg Oral Given 10/31/23 2013)  lactated ringers  bolus 500 mL (has no administration in time range)  lactated ringers  bolus 1,000 mL (  0 mLs Intravenous Stopped 10/31/23 1839)  iohexol  (OMNIPAQUE ) 350 MG/ML injection 80 mL (80 mLs Intravenous Contrast Given 10/31/23 1940)                                    Medical Decision Making Amount and/or Complexity of Data Reviewed Labs: ordered. Radiology: ordered.  Risk Prescription drug management.   This patient presents to the ED for concern of generalized weakness, this involves an extensive number of treatment options, and is a complaint that carries with it a high risk of complications and morbidity.  The differential diagnosis  includes heat injury, dehydration, arrhythmia, deconditioning, metabolic derangement, infection   Co morbidities / Chronic conditions that complicate the patient evaluation  Alzheimer's dementia, HTN   Additional history obtained:  Additional history obtained from EMR External records from outside source obtained and reviewed including EMS   Lab Tests:  I Ordered, and personally interpreted labs.  The pertinent results include: A leukocytosis is present.  Hemoglobin is normal.  Creatinine is consistent with baseline.  Electrolytes are normal.  Troponin is normal.  No evidence of UTI.   Imaging Studies ordered:  I ordered imaging studies including x-ray of chest, CT of head, CTA chest, CT of abdomen and pelvis I independently visualized and interpreted imaging which showed no acute findings I agree with the radiologist interpretation   Cardiac Monitoring: / EKG:  The patient was maintained on a cardiac monitor.  I personally viewed and interpreted the cardiac monitored which showed an underlying rhythm of: Sinus rhythm   Problem List / ED Course / Critical interventions / Medication management  Patient presenting for episode of generalized weakness.  This occurred after working out in the hot sun today.  He was given IV fluids prior to arrival.  He does feel mildly improved but still has some complaints of weakness.  He has no focal neurologic deficits on exam.  Despite documented also his dementia, he is currently alert and oriented.  Vital signs notable for tachycardia. He is normothermic.  Patient was placed on monitor.  Additional IV fluids and workup were initiated.  On monitor, patient does appear to be in a sinus tachycardia.  He does have a prolonged PR interval.  Heart rate improved with IV fluids.  Lab work was notable for leukocytosis only.  Patient underwent CT scans of head, chest, abdomen, pelvis.  No acute findings were identified.  No source of infection identified.   This weakness improved with IV fluids.  He remained mildly tachycardic.  Per chart review, he does take metoprolol  50 mg daily.  This was ordered.  Additional IV fluids were ordered.  Patient does feel back to baseline.  He was informed of his reassuring workup.  He does feel comfortable with discharge home.  Patient lives with another person who helps him out.  He was able to ambulate with walker while in the ED with no recurrence of weakness or near syncopal symptoms.  He was discharged in stable condition. I ordered medication including IV fluids for hydration, metoprolol  for home medication Reevaluation of the patient after these medicines showed that the patient improved I have reviewed the patients home medicines and have made adjustments as needed   Social Determinants of Health:  Lives at home with roommate.  Ambulates with walker at baseline.     Final diagnoses:  Generalized weakness  Dehydration    ED Discharge Orders  None          Melvenia Motto, MD 10/31/23 2107    Melvenia Motto, MD 10/31/23 [phone]

## 2023-10-31 NOTE — Discharge Instructions (Signed)
 Your test results today were reassuring.  Ensure that you drink plenty fluids to stay hydrated, especially on hot days.  Return to the emergency department for any new or worsening symptoms of concern.

## 2024-04-26 DEATH — deceased
